# Patient Record
Sex: Female | Born: 1975 | Race: Black or African American | Hispanic: No | Marital: Single | Smoking: Current every day smoker
Health system: Southern US, Community
[De-identification: ages and names within clinical notes are randomized; demographics above are authoritative.]

## PROBLEM LIST (undated history)

## (undated) DIAGNOSIS — I10 Essential (primary) hypertension: Secondary | ICD-10-CM

---

## 2004-10-12 ENCOUNTER — Emergency Department (HOSPITAL_COMMUNITY): Admission: EM | Admit: 2004-10-12 | Discharge: 2004-10-12 | Payer: Self-pay | Admitting: Emergency Medicine

## 2005-01-22 ENCOUNTER — Emergency Department (HOSPITAL_COMMUNITY): Admission: EM | Admit: 2005-01-22 | Discharge: 2005-01-22 | Payer: Self-pay | Admitting: Emergency Medicine

## 2005-04-30 ENCOUNTER — Ambulatory Visit (HOSPITAL_COMMUNITY): Admission: RE | Admit: 2005-04-30 | Discharge: 2005-04-30 | Payer: Self-pay | Admitting: Chiropractic Medicine

## 2006-12-26 ENCOUNTER — Ambulatory Visit (HOSPITAL_COMMUNITY): Admission: RE | Admit: 2006-12-26 | Discharge: 2006-12-26 | Payer: Self-pay | Admitting: Chiropractic Medicine

## 2008-06-02 ENCOUNTER — Inpatient Hospital Stay (HOSPITAL_COMMUNITY): Admission: AD | Admit: 2008-06-02 | Discharge: 2008-06-03 | Payer: Self-pay | Admitting: Obstetrics

## 2008-06-19 ENCOUNTER — Inpatient Hospital Stay (HOSPITAL_COMMUNITY): Admission: AD | Admit: 2008-06-19 | Discharge: 2008-06-19 | Payer: Self-pay | Admitting: Obstetrics

## 2008-07-24 ENCOUNTER — Inpatient Hospital Stay (HOSPITAL_COMMUNITY): Admission: AD | Admit: 2008-07-24 | Discharge: 2008-07-24 | Payer: Self-pay | Admitting: Obstetrics

## 2008-08-29 ENCOUNTER — Inpatient Hospital Stay (HOSPITAL_COMMUNITY): Admission: AD | Admit: 2008-08-29 | Discharge: 2008-08-29 | Payer: Self-pay | Admitting: Obstetrics

## 2008-09-04 ENCOUNTER — Inpatient Hospital Stay (HOSPITAL_COMMUNITY): Admission: RE | Admit: 2008-09-04 | Discharge: 2008-09-07 | Payer: Self-pay | Admitting: Obstetrics

## 2010-07-07 ENCOUNTER — Emergency Department (HOSPITAL_COMMUNITY): Admission: EM | Admit: 2010-07-07 | Discharge: 2010-07-07 | Payer: Self-pay | Admitting: Emergency Medicine

## 2010-08-18 ENCOUNTER — Ambulatory Visit: Payer: Self-pay | Admitting: Internal Medicine

## 2010-08-18 DIAGNOSIS — N63 Unspecified lump in unspecified breast: Secondary | ICD-10-CM

## 2010-08-18 DIAGNOSIS — H02849 Edema of unspecified eye, unspecified eyelid: Secondary | ICD-10-CM | POA: Insufficient documentation

## 2010-08-18 LAB — CONVERTED CEMR LAB
BUN: 9 mg/dL (ref 6–23)
CO2: 25 meq/L (ref 19–32)
Calcium: 9.5 mg/dL (ref 8.4–10.5)
Chloride: 104 meq/L (ref 96–112)
Creatinine, Ser: 0.77 mg/dL (ref 0.40–1.20)
Glucose, Bld: 72 mg/dL (ref 70–99)
Potassium: 4.7 meq/L (ref 3.5–5.3)
Sodium: 139 meq/L (ref 135–145)
TSH: 1.866 microintl units/mL (ref 0.350–4.500)

## 2010-09-12 ENCOUNTER — Encounter (INDEPENDENT_AMBULATORY_CARE_PROVIDER_SITE_OTHER): Payer: Self-pay | Admitting: Internal Medicine

## 2010-11-06 ENCOUNTER — Encounter (INDEPENDENT_AMBULATORY_CARE_PROVIDER_SITE_OTHER): Payer: Self-pay | Admitting: Internal Medicine

## 2010-11-06 ENCOUNTER — Ambulatory Visit: Payer: Self-pay | Admitting: Internal Medicine

## 2010-11-06 DIAGNOSIS — R030 Elevated blood-pressure reading, without diagnosis of hypertension: Secondary | ICD-10-CM

## 2010-11-06 DIAGNOSIS — A5901 Trichomonal vulvovaginitis: Secondary | ICD-10-CM | POA: Insufficient documentation

## 2010-11-06 DIAGNOSIS — J309 Allergic rhinitis, unspecified: Secondary | ICD-10-CM | POA: Insufficient documentation

## 2010-11-06 LAB — CONVERTED CEMR LAB
Bilirubin Urine: NEGATIVE
Blood in Urine, dipstick: NEGATIVE
Chlamydia, DNA Probe: NEGATIVE
GC Probe Amp, Genital: NEGATIVE
Glucose, Urine, Semiquant: NEGATIVE
Ketones, urine, test strip: NEGATIVE
Nitrite: NEGATIVE
Protein, U semiquant: NEGATIVE
Specific Gravity, Urine: <1.005
Urobilinogen, UA: 0.2
WBC Urine, dipstick: NEGATIVE
pH: 6.5

## 2010-11-16 ENCOUNTER — Encounter (INDEPENDENT_AMBULATORY_CARE_PROVIDER_SITE_OTHER): Payer: Self-pay | Admitting: Internal Medicine

## 2010-11-20 ENCOUNTER — Ambulatory Visit
Admission: RE | Admit: 2010-11-20 | Discharge: 2010-11-20 | Payer: Self-pay | Source: Home / Self Care | Attending: Internal Medicine | Admitting: Internal Medicine

## 2010-11-20 ENCOUNTER — Encounter (INDEPENDENT_AMBULATORY_CARE_PROVIDER_SITE_OTHER): Payer: Self-pay | Admitting: Internal Medicine

## 2010-11-20 LAB — CONVERTED CEMR LAB
Cholesterol: 188 mg/dL (ref 0–200)
LDL Cholesterol: 124 mg/dL — ABNORMAL HIGH (ref 0–99)
Rapid HIV Screen: NEGATIVE
Total CHOL/HDL Ratio: 3.8
Triglycerides: 76 mg/dL (ref <150)
VLDL: 15 mg/dL (ref 0–40)

## 2010-11-25 ENCOUNTER — Telehealth (INDEPENDENT_AMBULATORY_CARE_PROVIDER_SITE_OTHER): Payer: Self-pay | Admitting: Internal Medicine

## 2010-12-08 ENCOUNTER — Ambulatory Visit: Admit: 2010-12-08 | Payer: Self-pay | Admitting: Internal Medicine

## 2010-12-08 NOTE — Assessment & Plan Note (Signed)
Summary: req cpp, poss lumps on breast /lr   Vital Signs:  Patient profile:   35 year old female Menstrual status:  regular LMP:     07/23/2010 Height:      68 inches Weight:      226.2 pounds Temp:     98.3 degrees F Pulse rate:   78 / minute Pulse rhythm:   regular Resp:     16 per minute BP sitting:   132 / 96  Vitals Entered By: Michelle Nasuti (August 18, 2010 10:59 AM) CC: ESTABLISH CARE PT REQUEST CPP. PT C/O LUMP IN R BREAST X 3 WEEKS AGO Is Patient Diabetic? No Pain Assessment Patient in pain? no       Does patient need assistance? Ambulation Normal LMP (date): 07/23/2010     Menstrual Status regular Enter LMP: 07/23/2010   CC:  ESTABLISH CARE PT REQUEST CPP. PT C/O LUMP IN R BREAST X 3 WEEKS AGO.  History of Present Illness: 35 yo female here to establish  Concerns:  1.  Right tender breast lump near nipple area --noted 3 weeks ago.  Initially sore and swollen, but swelling decreased and no longer sore.  No drainage from the lesion noted.  Has never had happen before.  Not nursing.  No new bras that she has been wearing.  No fever.  2.  Puffiness of eyes in recent days to weeks.  No edema elsewhere.  No itchy, watery eyes, no sneezing or itchy watery nose, throat symptoms.  States she is sleeping well and feels well rested    Habits & Providers  Alcohol-Tobacco-Diet     Tobacco Status: current     Other Tobacco cigar     Other per week 14  Current Medications (verified): 1)  None  Allergies (verified): No Known Drug Allergies  Past History:  Past Medical History: unremarkable.  Past Surgical History: 1.   03/1995:  C section 2.   07/19/2002:  Csection 3.   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  Worked at [employer] for many years before moving to [place]. Unemployed currently Lives at home with 3 children. Tobacco Use:  Started age20--stopped with first child and started back up age 52--smokes 2 Black and Milds daily Alcohol:  Occasional Drug:  never. Smoking Status:  current  Physical Exam  Eyes:  No conjunctival injection, no watering.  Soft tissue about eyes and lids are indeed puffy appearing. Neck:  No thyromegaly appreciated.  No nodule. Breasts:  No mass,  thickening, tenderness, bulging, retraction, inflamation, nipple discharge or skin changes noted.  Generalized lumpiness beneath skin at margin of areola bilaterally--area at 10 O'Clock on right areola is where pt. feels some tenderness and there is a bit of lumpiness there--pea sized lesion. Extremities:  No edema.   Impression & Recommendations:  Problem # 1:  BREAST MASS, RIGHT (ICD-611.72) No concerning findings. Will get her back in next 2-3 months for CPP and reevaluate  to call if notes mass returning.  Problem # 2:  EDEMA OF EYELID (ICD-374.82) Not clear why she is having this Orders: T-Basic Metabolic Panel [id number]) T-TSH [phone])  Patient Instructions: 1)  Schedule for CPP first available with Dr. Delrae Alfred

## 2010-12-08 NOTE — Letter (Signed)
Summary: *HSN Results Follow up  [facility]  [address] [place], [place] [address]   Phone: [phone]  Fax: [phone]      09/12/2010   Alyza T Salahuddin [address] [place], [place]  [address]   Dear  Ms. Rozena Lineback,                            ____S.Drinkard,FNP   ____D. Gore,FNP       ____B. McPherson,MD   ____V. Rankins,MD    _X___E. Crystalmarie Yasin,MD    ____N. Daphine Deutscher, FNP  ____D. Reche Dixon, MD    ____K. Philipp Deputy, MD    ____Other     This letter is to inform you that your recent test(s):  _______Pap Smear    ___X____Lab Test     _______X-ray    ___X____ is within acceptable limits  _______ requires a medication change  _______ requires a follow-up lab visit  _______ requires a follow-up visit with your provider   Comments:  Thyroid and kidney testing was fine.       _________________________________________________________ If you have any questions, please contact our office                     Sincerely,  Julieanne Manson MD [facility]

## 2010-12-10 NOTE — Assessment & Plan Note (Signed)
Summary: BP recheck, labs  Nurse Visit   Vital Signs:  Patient profile:   35 year old female Menstrual status:  regular Weight:      220.5 pounds Temp:     97.0 degrees F oral Pulse rate:   76 / minute Pulse rhythm:   regular Resp:     20 per minute BP sitting:   124 / 92  (right arm) Cuff size:   regular  Vitals Entered By: Dutch Quint RN (November 20, 2010 10:01 AM) CC: BP recheck Is Patient Diabetic? No Pain Assessment Patient in pain? no       Does patient need assistance? Functional Status Self care Ambulation Normal   Review of Systems CV:  Denies CP, headache, visual changes, peripheral edema- asymptomatic.Marland Kitchen   Physical Exam  General:  alert, well-developed, well-nourished, well-hydrated, and overweight-appearing.     Patient Instructions: 1)  Reviewed with Dr. Delrae Alfred 2)  Your blood pressure is better, but still elevated. 3)  We will let you know the results of your blood work. 4)  Watch your salt intake, especially "hidden" salt in soups, processed food, soda. 5)  Return in one month for follow-up blood pressure check.  If it's still elevated, we may consider starting you on some medication. 6)  Call if you have any questions or anything changes.   Impression & Recommendations:  Problem # 1:  ELEVATED BLOOD PRESSURE WITHOUT DIAGNOSIS OF HYPERTENSION (ICD-796.2) BP better, still elevated Labs done To monitor salt intake Return in one month for f/u BP check with triage.  If BP still elevated, may consider meds  Orders: T-Lipid Profile (40981-19147) Est. Patient Level I (82956)  Complete Medication List: 1)  Metronidazole 500 Mg Tabs (Metronidazole) .... 4 tabs by mouth for one dose 2)  Loratadine 10 Mg Tabs (Loratadine) .Marland Kitchen.. 1 tab by mouth daily  Other Orders: T-Syphilis Test (RPR) ([id number]) Rapid HIV  (92370)   CC:  BP recheck.  History of Present Illness: 11/06/10 BP 130/94.  Not taking any BP meds at present - here for recheck and  labs.  States asymptomatic.   Allergies: No Known Drug Allergies Laboratory Results    Other Tests  Rapid HIV: negative   Orders Added: 1)  T-Lipid Profile [80061-22930] 2)  T-Syphilis Test (RPR) [69629-52841] 3)  Est. Patient Level I [32440] 4)  Rapid HIV  [10272]

## 2010-12-10 NOTE — Assessment & Plan Note (Signed)
Summary: FU FOR CPP///KT   Vital Signs:  Patient profile:   35 year old female Menstrual status:  regular Height:      68 inches Weight:      219 pounds BMI:     33.42 Temp:     97.8 degrees F oral Pulse rate:   80 / minute Pulse rhythm:   regular Resp:     18 per minute BP sitting:   130 / 94  (left arm) Cuff size:   regular  Vitals Entered By: Armenia Shannon (November 06, 2010 2:24 PM)  Vision Screening:Left eye w/o correction: 20 / 13 Right Eye w/o correction: 20 / 15-1 Both eyes w/o correction:  20/ 13-1        Vision Entered By: Armenia Shannon (November 06, 2010 2:31 PM)   History of Present Illness: 35 yo female here for CPP.  1.  Breast lumps:  Went away.  2.  Eyelid puffiness:  has not improved. No itchiness or watering. Never red.  No sense of sinuse congestion.  Eyes do not feel heavy.  Worse when awakens in morning.  Actually looks like water in leds in the morning.  Nothing new in bedroom.  No pets.  No sneezing.  There are times, however, when is sniffling and feels nasally congested.  No sore throat.  Allergies (verified): No Known Drug Allergies  Past History:  Past Medical History: Reviewed history from 08/18/2010 and no changes required. unremarkable.  Past Surgical History: Reviewed history from 08/18/2010 and no changes required. 1.   03/1995:  C section 2.   07/19/2002:  Csection 3.   09/04/08:  C section  Family History: Reviewed history from 08/18/2010 and no changes required. Mother, 46:  Healthy Father, 40:  Healthy 6 Sisters:  Oldest sister with hypertension, second oldest sister with new onset DM, obesity 2 Brothers:  Healthy 3 Children:  15, 8, 2:  all healthy  Social History: Reviewed history from 08/18/2010 and no changes required. Divorced Degree in Chiropractor, CNA, phlebotomy.  Worked at [employer] for many years before moving to [place]. Unemployed currently Lives at home with 3 children. Tobacco  Use:  Started age20--stopped with first child and started back up age 12--smokes 2 Black and Milds daily Alcohol:  Occasional Drug:  never.  Review of Systems General:  Energy good even chasing a 35 yo. Eyes:  glasses--astigmatism. ENT:  Denies decreased hearing. CV:  Denies chest pain or discomfort and palpitations. Resp:  Denies shortness of breath. GI:  Denies abdominal pain, bloody stools, constipation, dark tarry stools, and diarrhea. GU:  LMP:  10/28/10--normal and regular Mild dysuria noted today. MS:  Denies joint pain, joint redness, and joint swelling. Derm:  Denies lesion(s) and rash. Neuro:  Denies numbness, tingling, and weakness. Psych:  Denies anxiety, depression, and suicidal thoughts/plans; PHQ9 scored 0.  Physical Exam  General:  Overweight, NAD Head:  Normocephalic and atraumatic without obvious abnormalities. No apparent alopecia or balding. Eyes:  No corneal or conjunctival inflammation noted. EOMI. Perrla. Funduscopic exam benign, without hemorrhages, exudates or papilledema. Vision grossly normal.  Eyelids a bit puffy Ears:  External ear exam shows no significant lesions or deformities.  Otoscopic examination reveals clear canals, tympanic membranes are intact bilaterally without bulging, retraction, inflammation or discharge. Hearing is grossly normal bilaterally. Nose:  External nasal examination shows no deformity or inflammation. Nasal mucosa are pink and moist without lesions or exudates. Mouth:  Oral mucosa and oropharynx without lesions or exudates.  Teeth in good repair. Neck:  No deformities, masses, or tenderness noted. Chest Wall:  No deformities, masses, or tenderness noted. Breasts:  No mass, nodules, thickening, tenderness, bulging, retraction, inflamation, nipple discharge or skin changes noted.   Lungs:  Normal respiratory effort, chest expands symmetrically. Lungs are clear to auscultation, no crackles or wheezes. Heart:  Normal rate and regular  rhythm. S1 and S2 normal without gallop, murmur, click, rub or other extra sounds. Abdomen:  Bowel sounds positive,abdomen soft and non-tender without masses, organomegaly or hernias noted. Genitalia:  Pelvic Exam:        External: normal female genitalia without lesions or masses--possibly a bit of mucosal inflammation about vaginal introitus        Vagina: normal without lesions or masses        Cervix: normal without lesions or masses        Adnexa: normal bimanual exam without masses or fullness        Uterus: normal by palpation        Pap smear: performed Msk:  No deformity or scoliosis noted of thoracic or lumbar spine.   Pulses:  R and L carotid,radial,femoral,dorsalis pedis and posterior tibial pulses are full and equal bilaterally Extremities:  No clubbing, cyanosis, edema, or deformity noted with normal full range of motion of all joints.   Neurologic:  No cranial nerve deficits noted. Station and gait are normal. Plantar reflexes are down-going bilaterally. DTRs are symmetrical throughout. Sensory, motor and coordinative functions appear intact. Skin:  Intact without suspicious lesions or rashes Cervical Nodes:  No lymphadenopathy noted Axillary Nodes:  No palpable lymphadenopathy Inguinal Nodes:  No significant adenopathy Psych:  Cognition and judgment appear intact. Alert and cooperative with normal attention span and concentration. 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 states they broke up.  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 1 tab by mouth daily  Problem # 4:  ELEVATED BLOOD PRESSURE WITHOUT DIAGNOSIS OF HYPERTENSION (ICD-796.2) recheck with fasting labs  Complete Medication List: 1)  Metronidazole 500 Mg Tabs (Metronidazole) .... 4 tabs by mouth for one dose 2)  Loratadine 10 Mg Tabs (Loratadine) .Marland Kitchen.. 1 tab by mouth daily  Patient Instructions: 1)  Schedule for bp check and fasting labs in next 2 weeks:  FLP, HIV, RPR 2)  Follow up with Dr. Delrae Alfred in 4 months for blood pressure Prescriptions: LORATADINE 10 MG TABS (LORATADINE) 1 tab by mouth daily  #30 x 11   Entered and Authorized by:   Julieanne Manson MD   Signed by:   Julieanne Manson MD on 11/06/2010   Method used:   Faxed to ...       [facility] (retail)       [address] [place], [place]  [address]       Ph: [phone]       Fax: [phone]   RxID:   [id number] METRONIDAZOLE 500 MG TABS (METRONIDAZOLE) 4 tabs by mouth for one dose  #  4 x 0   Entered and Authorized by:   Julieanne Manson MD   Signed by:   Julieanne Manson MD on 11/06/2010   Method used:   Electronically to        [facility] [phone]* (retail)       [address]       [place], [place]  [address]       Ph: [phone]       Fax: [phone]   RxID:   [id number]    Orders Added: 1)  Vision Screening [phone] 2)  Est. Patient age 54-39 [33] 3)  Pap Smear, Thin Prep ( Collection of) [Q0091] 4)  KOH/ WET Mount [87210] 5)  T- GC Chlamydia [02725] 6)  T-Pap Smear, Thin Prep [36644]   Not Administered:    Influenza Vaccine not given due to: declined   Laboratory Results   Urine Tests    Routine Urinalysis   Glucose: negative   (Normal Range: Negative) Bilirubin: negative   (Normal Range: Negative) Ketone: negative   (Normal Range: Negative) Spec. Gravity: <1.005   (Normal  Range: 1.003-1.035) Blood: negative   (Normal Range: Negative) pH: 6.5   (Normal Range: 5.0-8.0) Protein: negative   (Normal Range: Negative) Urobilinogen: 0.2   (Normal Range: 0-1) Nitrite: negative   (Normal Range: Negative) Leukocyte Esterace: negative   (Normal Range: Negative)         Preventive Care Screening     Mammogram: never SBE:  Lumps near nipple have resolved Last Pap:  10/2009--always normal Osteoprevention:  1 serving milk daily.  Walking regularly Refuses flu vaccine.  Cannot recall last Td

## 2010-12-10 NOTE — Progress Notes (Signed)
Summary: Wants dental referral  Phone Note Call from Patient   Summary of Call: Wants dental referral.  Had mentioned on her way out after nurse visit.  Left message with female for pt. to return call.   Initial call taken by: Dutch Quint RN,  November 25, 2010 3:30 PM  Follow-up for Phone Call        Having problems with wisdom teeth in general.  Are really painful and throbbing.  Having trouble chewing, pain gives her headaches.  Denies fever, having swelling to gums.  Has been continuing for three weeks, would like dental referral to extract wisdom teeth. Follow-up by: Dutch Quint RN,  November 26, 2010 6:19 PM  Additional Follow-up for Phone Call Additional follow up Details #1::        She will need an OV as usual. I would just make a pt. an appt. If she does not have an infection, however, she needs to know that we will not be able to refer her. Let her know her cholesterol is okay, but would like her to work on her diet--eat more vegetables and fruits (5 servings a day if possible) and less fried fatty food and to get regular physical activity Additional Follow-up by: Julieanne Manson MD,  November 30, 2010 10:54 AM    Additional Follow-up for Phone Call Additional follow up Details #2::    Pt. advised of lab results and provider's recommendations, as well as response re dental referral.  Pt. verbalized understanding and agreement.  Appt. scheduled 12/08/10 for persistent tooth pain.  Dutch Quint RN  November 30, 2010 11:26 AM

## 2010-12-10 NOTE — Letter (Signed)
Summary: *HSN Results Follow up  [facility]  [address] [place], [place] [address]   Phone: [phone]  Fax: [phone]      11/16/2010   Aslin T Nesbitt [address] [place], [place]  [address]   Dear  Ms. Xiao Lalone,                            ____S.Drinkard,FNP   ____D. Gore,FNP       ____B. McPherson,MD   ____V. Rankins,MD    _X___E. Staton Markey,MD    ____N. Daphine Deutscher, FNP  ____D. Reche Dixon, MD    ____K. Philipp Deputy, MD    ____Other     This letter is to inform you that your recent test(s):  __X_____Pap Smear    ___X____Lab Test     _______X-ray    __X_____ is within acceptable limits  _______ requires a medication change  _______ requires a follow-up lab visit  _______ requires a follow-up visit with your provider   Comments:       _________________________________________________________ If you have any questions, please contact our office                     Sincerely,  Julieanne Manson MD [facility]

## 2010-12-10 NOTE — Progress Notes (Signed)
Summary: Office Visit//DEPRESSION SCREENIG  Office Visit//DEPRESSION SCREENIG   Imported By: Arta Bruce 11/10/2010 16:53:18  _____________________________________________________________________  External Attachment:    Type:   Image     Comment:   External Document

## 2011-01-13 ENCOUNTER — Inpatient Hospital Stay (HOSPITAL_COMMUNITY)
Admission: AD | Admit: 2011-01-13 | Discharge: 2011-01-13 | Disposition: A | Discharge status: Home / Self Care | Payer: Self-pay | Source: Ambulatory Visit | Attending: Obstetrics & Gynecology | Admitting: Obstetrics & Gynecology

## 2011-01-13 ENCOUNTER — Encounter (INDEPENDENT_AMBULATORY_CARE_PROVIDER_SITE_OTHER): Payer: Self-pay | Admitting: Internal Medicine

## 2011-01-13 ENCOUNTER — Telehealth (INDEPENDENT_AMBULATORY_CARE_PROVIDER_SITE_OTHER): Payer: Self-pay | Admitting: Internal Medicine

## 2011-01-13 DIAGNOSIS — R109 Unspecified abdominal pain: Secondary | ICD-10-CM

## 2011-01-13 DIAGNOSIS — A499 Bacterial infection, unspecified: Secondary | ICD-10-CM | POA: Insufficient documentation

## 2011-01-13 DIAGNOSIS — B9689 Other specified bacterial agents as the cause of diseases classified elsewhere: Secondary | ICD-10-CM | POA: Insufficient documentation

## 2011-01-13 DIAGNOSIS — N760 Acute vaginitis: Secondary | ICD-10-CM | POA: Insufficient documentation

## 2011-01-13 LAB — URINALYSIS, ROUTINE W REFLEX MICROSCOPIC
Bilirubin Urine: NEGATIVE
Glucose, UA: NEGATIVE mg/dL
Ketones, ur: NEGATIVE mg/dL
Leukocytes, UA: NEGATIVE
Nitrite: NEGATIVE
Specific Gravity, Urine: 1.02 (ref 1.005–1.030)
pH: 6 (ref 5.0–8.0)

## 2011-01-13 LAB — WET PREP, GENITAL
Trich, Wet Prep: NONE SEEN
Yeast Wet Prep HPF POC: NONE SEEN

## 2011-01-13 LAB — URINE MICROSCOPIC-ADD ON

## 2011-01-13 LAB — POCT PREGNANCY, URINE: Preg Test, Ur: NEGATIVE

## 2011-01-14 LAB — GC/CHLAMYDIA PROBE AMP, GENITAL
Chlamydia, DNA Probe: NEGATIVE
GC Probe Amp, Genital: NEGATIVE

## 2011-01-26 NOTE — Progress Notes (Signed)
Summary: pain, wanted to talk to [facility]  Phone Note Call from Patient Call back at [phone]   Reason for Call: Talk to Nurse Summary of Call: pat of Veronica King, discuss pain Initial call taken by: Ernestine Mcmurray,  January 13, 2011 8:05 AM  Follow-up for Phone Call        Pt. called [facility], went to [facility] ED.  Stated dx is BV - given ABT -- is currently "doing fine."  States she does not need f/u at present -- will call back if needed.  ED record on your desk.  Dutch Quint RN  January 14, 2011 12:42 PM

## 2011-01-26 NOTE — Letter (Signed)
Summary: NURSE NOTE ONLY  NURSE NOTE ONLY   Imported By: Arta Bruce 01/22/2011 16:27:59  _____________________________________________________________________  External Attachment:    Type:   Image     Comment:   External Document

## 2011-03-23 NOTE — Op Note (Signed)
NAMECAYLE, Veronica King                ACCOUNT NO.:  [id number]   MEDICAL RECORD NO.:  [id number]          PATIENT TYPE:  INP   LOCATION:  9143                          FACILITY:  [facility]   PHYSICIAN:  Kathreen Cosier, M.D.DATE OF BIRTH:  1976-02-20   DATE OF PROCEDURE:  09/04/2008  DATE OF DISCHARGE:                               OPERATIVE REPORT   PREOPERATIVE DIAGNOSIS:  Previous cesarean section x2 at term for  repeat.   POSTOPERATIVE DIAGNOSIS:  Previous cesarean section x2 at term for  repeat.   ANESTHESIA:  Spinal.   SURGEON:  Kathreen Cosier, M.D.   FIRST ASSISTANT:  Charles A. Clearance Coots, M.D.   PROCEDURE:  The patient placed on the operating table in the supine  position.  After the spinal administered, the abdomen prepped and  draped.  Bladder emptied with a Foley catheter.  A transverse suprapubic  incision made through the old scar, carried down to the rectus fascia.  Fascia cleaned and incised to length of the incision.  The recti muscles  were retracted laterally.  Peritoneum incised longitudinally.  Transverse incision made in the visceral peritoneum above the bladder.  Bladder mobilized inferiorly.  Transverse low uterine incision made.  Fluid cleared.  The patient delivered from the OP position of a female,  Apgar 8 and 9 weighing 7 pound 8 ounces and the team was in attendance.  Placenta was fundal, removed manually and sent to Labor and Delivery.  Uterine cavity cleaned with dry laps.  Uterine incision closed in 1  layer with continuous suture of #1 chromic.  Hemostasis was  satisfactory.  Bladder flap reattached with 2-0 chromic.  Uterus well  contracted.  Tubes and ovaries normal.  Abdomen closed in layers;  peritoneum, continuous suture of 0-chromic; fascia, continuous suture of  0-Dexon; and the skin closed with subcuticular stitch of 4-0 Monocryl.  Blood loss 500 mL.  The patient tolerated the procedure well and taken  to recovery room in good  condition.           ______________________________  Kathreen Cosier, M.D.     BAM/MEDQ  D:  09/04/2008  T:  09/04/2008  Job:  [id number]

## 2011-03-26 NOTE — Discharge Summary (Signed)
Veronica King, Veronica King                ACCOUNT NO.:  [id number]   MEDICAL RECORD NO.:  [id number]          PATIENT TYPE:  INP   LOCATION:  9143                          FACILITY:  [facility]   PHYSICIAN:  Kathreen Cosier, M.D.DATE OF BIRTH:  [birth year]   DATE OF ADMISSION:  09/04/2008  DATE OF DISCHARGE:  09/07/2008                               DISCHARGE SUMMARY   The patient is a 35 year old gravida 3, para 2-0-2, [place] September 08, 2008.  She had 2 C-sections in the past, and she was now at term and in  for repeat C-section.  She had a repeat low-transverse cesarean section.  Fluid clear.  She had a female from the OP position weighing 7 pounds 8  ounces, Apgar 8 and 9, and the placenta was sent to Labor and Delivery.  Postoperatively, she did fine.  Her hemoglobin was 10.1 postop.  She was  discharged on the third postoperative day ambulatory on a regular diet  to see me in 6 weeks.   DISCHARGE DIAGNOSIS:  Status post repeat low transverse cesarean section  at term.           ______________________________  Kathreen Cosier, M.D.     BAM/MEDQ  D:  10/09/2008  T:  10/09/2008  Job:  [id number]

## 2011-04-16 ENCOUNTER — Emergency Department (HOSPITAL_COMMUNITY)
Admission: EM | Admit: 2011-04-16 | Discharge: 2011-04-16 | Disposition: A | Discharge status: Home / Self Care | Payer: Self-pay | Attending: Nurse Practitioner | Admitting: Nurse Practitioner

## 2011-04-16 DIAGNOSIS — Z203 Contact with and (suspected) exposure to rabies: Secondary | ICD-10-CM | POA: Insufficient documentation

## 2011-04-16 DIAGNOSIS — I10 Essential (primary) hypertension: Secondary | ICD-10-CM | POA: Insufficient documentation

## 2011-04-19 ENCOUNTER — Inpatient Hospital Stay (INDEPENDENT_AMBULATORY_CARE_PROVIDER_SITE_OTHER)
Admission: RE | Admit: 2011-04-19 | Discharge: 2011-04-19 | Disposition: A | Discharge status: Home / Self Care | Payer: Self-pay | Source: Ambulatory Visit | Attending: Emergency Medicine | Admitting: Emergency Medicine

## 2011-04-19 DIAGNOSIS — Z23 Encounter for immunization: Secondary | ICD-10-CM

## 2011-04-23 ENCOUNTER — Inpatient Hospital Stay (INDEPENDENT_AMBULATORY_CARE_PROVIDER_SITE_OTHER)
Admission: RE | Admit: 2011-04-23 | Discharge: 2011-04-23 | Disposition: A | Discharge status: Home / Self Care | Payer: Self-pay | Source: Ambulatory Visit | Attending: Family Medicine | Admitting: Family Medicine

## 2011-04-23 DIAGNOSIS — Z23 Encounter for immunization: Secondary | ICD-10-CM

## 2011-04-30 ENCOUNTER — Inpatient Hospital Stay (INDEPENDENT_AMBULATORY_CARE_PROVIDER_SITE_OTHER)
Admission: RE | Admit: 2011-04-30 | Discharge: 2011-04-30 | Disposition: A | Discharge status: Home / Self Care | Payer: Self-pay | Source: Ambulatory Visit

## 2011-04-30 DIAGNOSIS — Z23 Encounter for immunization: Secondary | ICD-10-CM

## 2011-08-06 LAB — URINALYSIS, ROUTINE W REFLEX MICROSCOPIC
Glucose, UA: NEGATIVE
Hgb urine dipstick: NEGATIVE
Ketones, ur: NEGATIVE
Leukocytes, UA: NEGATIVE
Nitrite: NEGATIVE
Protein, ur: 30 — AB
Specific Gravity, Urine: >1.03 — ABNORMAL HIGH
Urobilinogen, UA: 1
pH: 6

## 2011-08-06 LAB — WET PREP, GENITAL
Clue Cells Wet Prep HPF POC: NONE SEEN
Trich, Wet Prep: NONE SEEN
Yeast Wet Prep HPF POC: NONE SEEN

## 2011-08-06 LAB — URINE MICROSCOPIC-ADD ON: RBC / HPF: NONE SEEN

## 2011-08-06 LAB — URINE CULTURE
Colony Count: NO GROWTH
Culture: NO GROWTH

## 2011-08-10 LAB — CBC
HCT: 30.3 — ABNORMAL LOW
HCT: 30.4 — ABNORMAL LOW
Hemoglobin: 10.1 — ABNORMAL LOW
Hemoglobin: 10.1 — ABNORMAL LOW
MCHC: 33.2
MCHC: 33.3
MCV: 88
MCV: 88.4
Platelets: 263
Platelets: 283
RBC: 3.44 — ABNORMAL LOW
RBC: 3.44 — ABNORMAL LOW
RDW: 15.5
RDW: 15.8 — ABNORMAL HIGH
WBC: 5.5
WBC: 8.2

## 2011-08-10 LAB — RPR: RPR Ser Ql: NONREACTIVE

## 2012-01-31 ENCOUNTER — Encounter (HOSPITAL_COMMUNITY): Payer: Self-pay | Admitting: *Deleted

## 2012-01-31 ENCOUNTER — Emergency Department (HOSPITAL_COMMUNITY)
Admission: EM | Admit: 2012-01-31 | Discharge: 2012-01-31 | Disposition: A | Discharge status: Home / Self Care | Payer: Self-pay | Source: Home / Self Care

## 2012-01-31 DIAGNOSIS — J0300 Acute streptococcal tonsillitis, unspecified: Secondary | ICD-10-CM

## 2012-01-31 DIAGNOSIS — J020 Streptococcal pharyngitis: Secondary | ICD-10-CM

## 2012-01-31 HISTORY — DX: Essential (primary) hypertension: I10

## 2012-01-31 MED ORDER — HYDROCODONE-ACETAMINOPHEN 7.5-500 MG/15ML PO SOLN: 10.0000 mL | ORAL | Status: AC | PRN

## 2012-01-31 MED ORDER — AZITHROMYCIN 250 MG PO TABS: 250.0000 mg | ORAL_TABLET | Freq: Every day | ORAL | Status: AC

## 2012-01-31 NOTE — ED Provider Notes (Signed)
History     CSN: [id number]  Arrival date & time 01/31/12  1552   None     Chief Complaint  Patient presents with  . Sore Throat    (Consider location/radiation/quality/duration/timing/severity/associated sxs/prior treatment) HPI Comments: The patient reports sore throat, tonsillar exudate, chills, cough, body aches x 3 days.  States she is drinking but has not eaten because of the pain in her throat.  She has also been having diarrhea.  Denies abdominal pain, chest pain, SOB, vomiting or urinary symptoms.    The history is provided by the patient.    Past Medical History  Diagnosis Date  . Hypertension     History reviewed. No pertinent past surgical history.  No family history on file.  History  Substance Use Topics  . Smoking status: Current Everyday Smoker  . Smokeless tobacco: Not on file  . Alcohol Use: Yes    OB History    Grav Para Term Preterm Abortions TAB SAB Ect Mult Living                  Review of Systems  Constitutional: Positive for chills.  HENT: Positive for sore throat. Negative for ear pain, congestion, rhinorrhea, drooling, trouble swallowing and neck stiffness.   Respiratory: Positive for cough. Negative for shortness of breath, wheezing and stridor.   Gastrointestinal: Positive for diarrhea. Negative for nausea, vomiting, abdominal pain and blood in stool.  Genitourinary: Negative for dysuria, urgency, frequency, vaginal bleeding and vaginal discharge.    Allergies  Review of patient's allergies indicates not on file.  Home Medications   Current Outpatient Rx  Name Route Sig Dispense Refill  . LISINOPRIL 20 MG PO TABS Oral Take 20 mg by mouth daily.      BP 126/81  Pulse 86  Temp(Src) 99 F (37.2 C) (Oral)  Resp 14  SpO2 100%  LMP 01/31/2012  Physical Exam  Nursing note and vitals reviewed. Constitutional: She is oriented to person, place, and time. She appears well-developed and well-nourished. No distress.  HENT:  Head:  Normocephalic and atraumatic.  Mouth/Throat: Uvula is midline and mucous membranes are normal. Mucous membranes are not dry. No uvula swelling. Oropharyngeal exudate, posterior oropharyngeal edema and posterior oropharyngeal erythema present. No tonsillar abscesses.       Bilateral tonsillar enlargement is symmetric.   Neck: Trachea normal, normal range of motion and phonation normal. Neck supple. No tracheal tenderness present. No rigidity. No tracheal deviation and normal range of motion present.  Cardiovascular: Normal rate, regular rhythm and normal heart sounds.   Pulmonary/Chest: Effort normal and breath sounds normal. No stridor. No respiratory distress. She has no wheezes. She has no rales.  Musculoskeletal: Normal range of motion.  Lymphadenopathy:    She has cervical adenopathy.  Neurological: She is alert and oriented to person, place, and time.  Skin: She is not diaphoretic.    ED Course  Procedures (including critical care time)  Labs Reviewed - No data to display No results found.   1. Strep tonsillitis       MDM  Patient with history of chills, now with tonsillar enlargement with exudate, anterior cervical lymphadenopathy, absence of cough.  Will treat empirically for strep.  Patient's airway is widely patent, tolerating secretions without difficulty.  Patient d/c home with antibiotic, pain medication, return precautions.  Patient verbalizes understanding and agrees with plan.           Dillard Cannon [place], [place] 01/31/12 1807

## 2012-01-31 NOTE — ED Notes (Signed)
Pt  Reports   Symptoms  Of  Body  Aches   sorethroat     And  A  Cough     Symptoms  X  3  Days      -    She  Is alert  Oriented     Speaking in  Complete  sentances

## 2012-01-31 NOTE — Discharge Instructions (Signed)
Please read the information below.  Drink plenty of fluids.  You may take ibuprofen (motrin) in addition to the prescribed pain medication but please do not take additional tylenol, as this formula already contains tylenol.  If you develop high fevers while on antibiotics, have difficulty swallowing or breathing (unable to swallow your saliva), or are unable to tolerate fluids by mouth, go directly to the Emergency Room.  You may return to the urgent care at any time for worsening condition or any new symptoms that concern you.    Pharyngitis, Viral and Bacterial Pharyngitis is soreness (inflammation) or infection of the pharynx. It is also called a sore throat. CAUSES  Most sore throats are caused by viruses and are part of a cold. However, some sore throats are caused by strep and other bacteria. Sore throats can also be caused by post nasal drip from draining sinuses, allergies and sometimes from sleeping with an open mouth. Infectious sore throats can be spread from person to person by coughing, sneezing and sharing cups or eating utensils. TREATMENT  Sore throats that are viral usually last 3-4 days. Viral illness will get better without medications (antibiotics). Strep throat and other bacterial infections will usually begin to get better about 24-48 hours after you begin to take antibiotics. HOME CARE INSTRUCTIONS   If the caregiver feels there is a bacterial infection or if there is a positive strep test, they will prescribe an antibiotic. The full course of antibiotics must be taken. If the full course of antibiotic is not taken, you or your child may become ill again. If you or your child has strep throat and do not finish all of the medication, serious heart or kidney diseases may develop.   Drink enough water and fluids to keep your urine clear or pale yellow.   Only take over-the-counter or prescription medicines for pain, discomfort or fever as directed by your caregiver.   Get lots of  rest.   Gargle with salt water ( tsp. of salt in a glass of water) as often as every 1-2 hours as you need for comfort.   Hard candies may soothe the throat if individual is not at risk for choking. Throat sprays or lozenges may also be used.  SEEK MEDICAL CARE IF:   Large, tender lumps in the neck develop.   A rash develops.   Green, yellow-brown or bloody sputum is coughed up.   Your baby is older than 3 months with a rectal temperature of 100.5 F (38.1 C) or higher for more than 1 day.  SEEK IMMEDIATE MEDICAL CARE IF:   A stiff neck develops.   You or your child are drooling or unable to swallow liquids.   You or your child are vomiting, unable to keep medications or liquids down.   You or your child has severe pain, unrelieved with recommended medications.   You or your child are having difficulty breathing (not due to stuffy nose).   You or your child are unable to fully open your mouth.   You or your child develop redness, swelling, or severe pain anywhere on the neck.   You have a fever.   Your baby is older than 3 months with a rectal temperature of 102 F (38.9 C) or higher.   Your baby is 82 months old or younger with a rectal temperature of 100.4 F (38 C) or higher.  MAKE SURE YOU:   Understand these instructions.   Will watch your condition.  Will get help right away if you are not doing well or get worse.  Document Released: 10/25/2005 Document Revised: 10/14/2011 Document Reviewed: 01/22/2008 [facility] Patient Information 2012 [place], [place].  Strep Throat Strep throat is an infection of the throat caused by a bacteria named Streptococcus pyogenes. Your caregiver may call the infection streptococcal "tonsillitis" or "pharyngitis" depending on whether there are signs of inflammation in the tonsils or back of the throat. Strep throat is most common in children from 32 to 58 years old during the cold months of the year, but it can occur in people of any  age during any season. This infection is spread from person to person (contagious) through coughing, sneezing, or other close contact. SYMPTOMS   Fever or chills.   Painful, swollen, red tonsils or throat.   Pain or difficulty when swallowing.   White or yellow spots on the tonsils or throat.   Swollen, tender lymph nodes or "glands" of the neck or under the jaw.   Red rash all over the body (rare).  DIAGNOSIS  Many different infections can cause the same symptoms. A test must be done to confirm the diagnosis so the right treatment can be given. A "rapid strep test" can help your caregiver make the diagnosis in a few minutes. If this test is not available, a light swab of the infected area can be used for a throat culture test. If a throat culture test is done, results are usually available in a day or two. TREATMENT  Strep throat is treated with antibiotic medicine. HOME CARE INSTRUCTIONS   Gargle with 1 tsp of salt in 1 cup of warm water, 3 to 4 times per day or as needed for comfort.   Family members who also have a sore throat or fever should be tested for strep throat and treated with antibiotics if they have the strep infection.   Make sure everyone in your household washes their hands well.   Do not share food, drinking cups, or personal items that could cause the infection to spread to others.   You may need to eat a soft food diet until your sore throat gets better.   Drink enough water and fluids to keep your urine clear or pale yellow. This will help prevent dehydration.   Get plenty of rest.   Stay home from school, daycare, or work until you have been on antibiotics for 24 hours.   Only take over-the-counter or prescription medicines for pain, discomfort, or fever as directed by your caregiver.   If antibiotics are prescribed, take them as directed. Finish them even if you start to feel better.  SEEK MEDICAL CARE IF:   The glands in your neck continue to enlarge.    You develop a rash, cough, or earache.   You cough up green, yellow-brown, or bloody sputum.   You have pain or discomfort not controlled by medicines.   Your problems seem to be getting worse rather than better.  SEEK IMMEDIATE MEDICAL CARE IF:   You develop any new symptoms such as vomiting, severe headache, stiff or painful neck, chest pain, shortness of breath, or trouble swallowing.   You develop severe throat pain, drooling, or changes in your voice.   You develop swelling of the neck, or the skin on the neck becomes red and tender.   You have a fever.   You develop signs of dehydration, such as fatigue, dry mouth, and decreased urination.   You become increasingly sleepy, or  you cannot wake up completely.  Document Released: 10/22/2000 Document Revised: 10/14/2011 Document Reviewed: 12/24/2010 [facility] Patient Information 2012 [place], [place].

## 2012-02-01 ENCOUNTER — Telehealth (HOSPITAL_COMMUNITY): Payer: Self-pay | Admitting: *Deleted

## 2012-02-01 NOTE — ED Notes (Signed)
1300 Pt. called and said her insurance does not cover the medication. I asked her what insurance does she have? She said the orange card. I told her that is not an insurance card. It just makes her eligible for [facility]. She said we sent the Rx. to [facility] and she can get it at [facility] for $4.00. She said the Z- pack is $42.00 at [facility] and the does not know how much the narcotic is. I told her it usually not very expensive. I told her I would call back. Discussed with Esperanza Sheets PA and she said [facility] won't fill our Rx.'s anymore. Pt. has to see their provider to use their pharmacy.  She changed the Rx. to Amoxicillin 500 mg. TID # 30, she can get that at [facility] on the $4.00 list. If the Vicodin is to expensive she can use plain Tylenol. I called pt. back and gave her this information. She wants the Amoxicillin called to [facility] on [address] Rx. called to voicemail @ [phone]. Vassie Moselle 02/01/2012

## 2012-02-01 NOTE — ED Provider Notes (Signed)
Medical screening examination/treatment/procedure(s) were performed by resident physician or non-physician practitioner and as supervising physician I was immediately available for consultation/collaboration.   Barkley Bruns MD.    Linna Hoff, MD 02/01/12 1556

## 2013-08-04 ENCOUNTER — Encounter (HOSPITAL_COMMUNITY): Payer: Self-pay | Admitting: *Deleted

## 2013-08-04 ENCOUNTER — Emergency Department (HOSPITAL_COMMUNITY)
Admission: EM | Admit: 2013-08-04 | Discharge: 2013-08-05 | Disposition: A | Discharge status: Home / Self Care | Payer: Self-pay | Attending: Emergency Medicine | Admitting: Emergency Medicine

## 2013-08-04 DIAGNOSIS — N12 Tubulo-interstitial nephritis, not specified as acute or chronic: Secondary | ICD-10-CM | POA: Insufficient documentation

## 2013-08-04 DIAGNOSIS — F17200 Nicotine dependence, unspecified, uncomplicated: Secondary | ICD-10-CM | POA: Insufficient documentation

## 2013-08-04 DIAGNOSIS — Z3202 Encounter for pregnancy test, result negative: Secondary | ICD-10-CM | POA: Insufficient documentation

## 2013-08-04 DIAGNOSIS — R300 Dysuria: Secondary | ICD-10-CM | POA: Insufficient documentation

## 2013-08-04 DIAGNOSIS — Z79899 Other long term (current) drug therapy: Secondary | ICD-10-CM | POA: Insufficient documentation

## 2013-08-04 DIAGNOSIS — R509 Fever, unspecified: Secondary | ICD-10-CM | POA: Insufficient documentation

## 2013-08-04 DIAGNOSIS — I10 Essential (primary) hypertension: Secondary | ICD-10-CM | POA: Insufficient documentation

## 2013-08-04 LAB — URINALYSIS, ROUTINE W REFLEX MICROSCOPIC
Bilirubin Urine: NEGATIVE
Ketones, ur: NEGATIVE mg/dL
Nitrite: NEGATIVE
Urobilinogen, UA: 0.2 mg/dL (ref 0.0–1.0)
pH: 7 (ref 5.0–8.0)

## 2013-08-04 LAB — URINE MICROSCOPIC-ADD ON

## 2013-08-04 NOTE — ED Notes (Signed)
Pt c/o mid level right sided back pain radiating to left side with urinary retention and low voiding volumes x 1 week. N/V/D

## 2013-08-05 ENCOUNTER — Emergency Department (HOSPITAL_COMMUNITY): Payer: Self-pay

## 2013-08-05 ENCOUNTER — Telehealth (HOSPITAL_COMMUNITY): Payer: Self-pay | Admitting: Emergency Medicine

## 2013-08-05 ENCOUNTER — Encounter (HOSPITAL_COMMUNITY): Payer: Self-pay | Admitting: Radiology

## 2013-08-05 LAB — CBC WITH DIFFERENTIAL/PLATELET
Basophils Absolute: 0 10*3/uL (ref 0.0–0.1)
Basophils Relative: 0 % (ref 0–1)
Eosinophils Absolute: 0 10*3/uL (ref 0.0–0.7)
Eosinophils Relative: 0 % (ref 0–5)
HCT: 36.5 % (ref 36.0–46.0)
Lymphocytes Relative: 22 % (ref 12–46)
Lymphs Abs: 1.6 10*3/uL (ref 0.7–4.0)
MCH: 32.9 pg (ref 26.0–34.0)
MCHC: 35.9 g/dL (ref 30.0–36.0)
MCV: 91.7 fL (ref 78.0–100.0)
Monocytes Absolute: 0.8 10*3/uL (ref 0.1–1.0)
Platelets: 202 10*3/uL (ref 150–400)
RBC: 3.98 MIL/uL (ref 3.87–5.11)
WBC: 7.4 10*3/uL (ref 4.0–10.5)

## 2013-08-05 LAB — COMPREHENSIVE METABOLIC PANEL
Albumin: 3.4 g/dL — ABNORMAL LOW (ref 3.5–5.2)
Alkaline Phosphatase: 121 U/L — ABNORMAL HIGH (ref 39–117)
BUN: 12 mg/dL (ref 6–23)
CO2: 23 mEq/L (ref 19–32)
Calcium: 9 mg/dL (ref 8.4–10.5)
Creatinine, Ser: 0.84 mg/dL (ref 0.50–1.10)
GFR calc Af Amer: >90 mL/min (ref >90)
GFR calc non Af Amer: 88 mL/min — ABNORMAL LOW (ref >90)
Glucose, Bld: 87 mg/dL (ref 70–99)
Total Protein: 7.4 g/dL (ref 6.0–8.3)

## 2013-08-05 LAB — LIPASE, BLOOD: Lipase: 19 U/L (ref 11–59)

## 2013-08-05 MED ORDER — METOCLOPRAMIDE HCL 10 MG PO TABS: 10.0000 mg | ORAL_TABLET | Freq: Four times a day (QID) | ORAL | Status: DC | PRN

## 2013-08-05 MED ORDER — DEXTROSE 5 % IV SOLN
1.0000 g | Freq: Once | INTRAVENOUS | Status: AC
  Administered 2013-08-05: 1 g via INTRAVENOUS
  Filled 2013-08-05: qty 10

## 2013-08-05 MED ORDER — OXYCODONE-ACETAMINOPHEN 5-325 MG PO TABS: 2.0000 | ORAL_TABLET | Freq: Four times a day (QID) | ORAL | Status: DC | PRN

## 2013-08-05 MED ORDER — ONDANSETRON HCL 4 MG/2ML IJ SOLN
4.0000 mg | Freq: Once | INTRAMUSCULAR | Status: AC
  Administered 2013-08-05: 4 mg via INTRAVENOUS
  Filled 2013-08-05: qty 2

## 2013-08-05 MED ORDER — SODIUM CHLORIDE 0.9 % IV BOLUS (SEPSIS)
1000.0000 mL | Freq: Once | INTRAVENOUS | Status: AC
  Administered 2013-08-05: 1000 mL via INTRAVENOUS

## 2013-08-05 MED ORDER — CIPROFLOXACIN HCL 500 MG PO TABS: 500.0000 mg | ORAL_TABLET | Freq: Two times a day (BID) | ORAL | Status: DC

## 2013-08-05 MED ORDER — MORPHINE SULFATE 4 MG/ML IJ SOLN
4.0000 mg | Freq: Once | INTRAMUSCULAR | Status: AC
  Administered 2013-08-05: 4 mg via INTRAVENOUS
  Filled 2013-08-05: qty 1

## 2013-08-05 NOTE — ED Provider Notes (Signed)
CSN: [id number]     Arrival date & time 08/04/13  2054 History   First MD Initiated Contact with Patient 08/04/13 2358     Chief Complaint  Patient presents with  . Back Pain   (Consider location/radiation/quality/duration/timing/severity/associated sxs/prior Treatment) The history is provided by the patient.  Veronica King is a 37 y.o. female history of hypertension here presenting with flank pain and dysuria. She's been having some pain when she urinates as well as decreased urine output for the last week. Since yesterday she developed some right flank pain that is sharp and constant. She took some Motrin with no relief. Today the pain radiated to the left side as well as she had one episode of vomiting. Also had fever 102 yesterday.    Past Medical History  Diagnosis Date  . Hypertension    No past surgical history on file. No family history on file. History  Substance Use Topics  . Smoking status: Current Every Day Smoker  . Smokeless tobacco: Not on file  . Alcohol Use: Yes   OB History   Grav Para Term Preterm Abortions TAB SAB Ect Mult Living                 Review of Systems  Constitutional: Positive for fever.  Gastrointestinal: Positive for abdominal pain.  Musculoskeletal: Positive for back pain.  All other systems reviewed and are negative.    Allergies  Review of patient's allergies indicates no known allergies.  Home Medications   Current Outpatient Rx  Name  Route  Sig  Dispense  Refill  . Cranberry (SM CRANBERRY) 300 MG tablet   Oral   Take 300 mg by mouth daily.          BP 138/78  Pulse 88  Temp(Src) 98.1 F (36.7 C) (Oral)  Resp 18  SpO2 96% Physical Exam  Nursing note and vitals reviewed. Constitutional: She is oriented to person, place, and time. She appears well-developed and well-nourished.  HENT:  Head: Normocephalic.  Mouth/Throat: Oropharynx is clear and moist.  Eyes: Conjunctivae are normal. Pupils are equal, round, and  reactive to light.  Neck: Normal range of motion.  Cardiovascular: Normal rate, regular rhythm and normal heart sounds.   Pulmonary/Chest: Effort normal and breath sounds normal. No respiratory distress. She has no wheezes. She has no rales.  Abdominal: Soft.  + suprapubic tenderness. + R CVAT. Mild periumbilical tenderness and RLQ tenderness as well. No rebound.   Musculoskeletal: Normal range of motion.  Neurological: She is alert and oriented to person, place, and time.  Skin: Skin is warm and dry.  Psychiatric: She has a normal mood and affect. Her behavior is normal. Judgment and thought content normal.    ED Course  Procedures (including critical care time) Labs Review Labs Reviewed  URINALYSIS, ROUTINE W REFLEX MICROSCOPIC - Abnormal; Notable for the following:    APPearance TURBID (*)    Hgb urine dipstick MODERATE (*)    Protein, ur 30 (*)    Leukocytes, UA LARGE (*)    All other components within normal limits  URINE MICROSCOPIC-ADD ON - Abnormal; Notable for the following:    Squamous Epithelial / LPF FEW (*)    Bacteria, UA FEW (*)    All other components within normal limits  COMPREHENSIVE METABOLIC PANEL - Abnormal; Notable for the following:    Albumin 3.4 (*)    Alkaline Phosphatase 121 (*)    GFR calc non Af Amer 88 (*)  All other components within normal limits  URINE CULTURE  CBC WITH DIFFERENTIAL  LIPASE, BLOOD  POCT PREGNANCY, URINE   Imaging Review Ct Abdomen Pelvis Wo Contrast  08/05/2013   CLINICAL DATA:  Right-sided back pain with urinary retention and decreased urinary volumes for 1 week. Rule out stone. Nausea vomiting and diarrhea.  EXAM: CT ABDOMEN AND PELVIS WITHOUT CONTRAST  TECHNIQUE: Multidetector CT imaging of the abdomen and pelvis was performed following the standard protocol without intravenous contrast.  COMPARISON:  None.  FINDINGS: Lung bases: Motion degradation. Grossly clear lungs. Normal heart size without pericardial or pleural  effusion.  Abdomen/Pelvis: Degradation continuing secondary to motion minimally into the abdomen. Normal liver, spleen, stomach, pancreas, gallbladder, biliary tract, adrenal glands.  No renal calculi or hydronephrosis. No hydroureter or ureteric calculi.  No renal calculi or hydronephrosis. Colonic stool burden suggests constipation. Normal terminal ileum and appendix. Normal small bowel without abdominal ascites. No pelvic adenopathy. Normal urinary bladder. Intrauterine device. No adnexal mass. No significant free fluid.  Bones/Musculoskeletal: No acute osseous abnormality.  IMPRESSION: 1.  No urinary tract calculi or hydronephrosis. 2.  Possible constipation. 3. Normal appendix.   Electronically Signed   By: Jeronimo Greaves   On: 08/05/2013 02:23    MDM  No diagnosis found. Veronica King is a 37 y.o. female here with ab pain, flank pain. Likely pyelo vs stone. Will get labs, UA, CT ab/pel.   3:11 AM Labs unremarkable. UA + blood and large leuks. Given ceftriaxone in the ED. CT showed no stone and nl appendix. Pain improved. Will d/c home on cipro, percocet prn, reglan. She likely has pyelo.    Richardean Canal, MD 08/05/13 [phone]

## 2013-08-07 LAB — URINE CULTURE: Colony Count: >=100000

## 2013-09-27 ENCOUNTER — Ambulatory Visit: Payer: No Typology Code available for payment source | Attending: Internal Medicine

## 2013-10-17 ENCOUNTER — Ambulatory Visit: Payer: No Typology Code available for payment source | Attending: Internal Medicine | Admitting: Internal Medicine

## 2013-10-17 ENCOUNTER — Encounter: Payer: Self-pay | Admitting: Internal Medicine

## 2013-10-17 VITALS — BP 160/100 | HR 74 | Temp 98.4°F | Resp 17 | Wt 207.2 lb

## 2013-10-17 DIAGNOSIS — Z139 Encounter for screening, unspecified: Secondary | ICD-10-CM

## 2013-10-17 DIAGNOSIS — I10 Essential (primary) hypertension: Secondary | ICD-10-CM | POA: Insufficient documentation

## 2013-10-17 DIAGNOSIS — F17200 Nicotine dependence, unspecified, uncomplicated: Secondary | ICD-10-CM

## 2013-10-17 DIAGNOSIS — N898 Other specified noninflammatory disorders of vagina: Secondary | ICD-10-CM

## 2013-10-17 LAB — CBC WITH DIFFERENTIAL/PLATELET
Basophils Absolute: 0 10*3/uL (ref 0.0–0.1)
Basophils Relative: 0 % (ref 0–1)
Eosinophils Absolute: 0 10*3/uL (ref 0.0–0.7)
Eosinophils Relative: 1 % (ref 0–5)
HCT: 38.4 % (ref 36.0–46.0)
Hemoglobin: 13.3 g/dL (ref 12.0–15.0)
Lymphocytes Relative: 47 % — ABNORMAL HIGH (ref 12–46)
Lymphs Abs: 2.3 10*3/uL (ref 0.7–4.0)
MCH: 31.4 pg (ref 26.0–34.0)
MCHC: 34.6 g/dL (ref 30.0–36.0)
MCV: 90.6 fL (ref 78.0–100.0)
Monocytes Absolute: 0.3 10*3/uL (ref 0.1–1.0)
Monocytes Relative: 6 % (ref 3–12)
Neutro Abs: 2.2 10*3/uL (ref 1.7–7.7)
Neutrophils Relative %: 46 % (ref 43–77)
Platelets: 264 10*3/uL (ref 150–400)
RBC: 4.24 MIL/uL (ref 3.87–5.11)
RDW: 13.8 % (ref 11.5–15.5)
WBC: 4.9 10*3/uL (ref 4.0–10.5)

## 2013-10-17 LAB — COMPLETE METABOLIC PANEL WITH GFR
ALT: 18 U/L (ref 0–35)
AST: 18 U/L (ref 0–37)
Albumin: 4 g/dL (ref 3.5–5.2)
Alkaline Phosphatase: 112 U/L (ref 39–117)
BUN: 10 mg/dL (ref 6–23)
CO2: 26 mEq/L (ref 19–32)
Calcium: 9.4 mg/dL (ref 8.4–10.5)
Chloride: 104 mEq/L (ref 96–112)
Creat: 0.76 mg/dL (ref 0.50–1.10)
GFR, Est African American: >89 mL/min
GFR, Est Non African American: >89 mL/min
Glucose, Bld: 80 mg/dL (ref 70–99)
Potassium: 4 mEq/L (ref 3.5–5.3)
Sodium: 136 mEq/L (ref 135–145)
Total Bilirubin: 0.3 mg/dL (ref 0.3–1.2)
Total Protein: 7.4 g/dL (ref 6.0–8.3)

## 2013-10-17 LAB — LIPID PANEL
Cholesterol: 163 mg/dL (ref 0–200)
HDL: 46 mg/dL (ref >39)
LDL Cholesterol: 95 mg/dL (ref 0–99)
Total CHOL/HDL Ratio: 3.5 Ratio
Triglycerides: 110 mg/dL (ref <150)
VLDL: 22 mg/dL (ref 0–40)

## 2013-10-17 MED ORDER — NICOTINE 21 MG/24HR TD PT24: 21.0000 mg | MEDICATED_PATCH | Freq: Every day | TRANSDERMAL | Status: DC

## 2013-10-17 MED ORDER — AMLODIPINE BESYLATE 5 MG PO TABS: 5.0000 mg | ORAL_TABLET | Freq: Every day | ORAL | Status: DC

## 2013-10-17 MED ORDER — METRONIDAZOLE 500 MG PO TABS: 500.0000 mg | ORAL_TABLET | Freq: Three times a day (TID) | ORAL | Status: DC

## 2013-10-17 NOTE — Progress Notes (Signed)
Patient here to establish care Was diagnosed with HTN but not on any medicine Has history of BV-having symptoms of foul (fish ) odor And yellowish milky discharge

## 2013-10-17 NOTE — Patient Instructions (Signed)

## 2013-10-17 NOTE — Progress Notes (Signed)
MRN: [id number] Name: Veronica King  Sex: female Age: 37 y.o. DOB: January 02, 1976  Allergies: Review of patient's allergies indicates no known allergies.  Chief Complaint  Patient presents with  . BV  . Establish Care    HPI: Patient is 37 y.o. female who comes for the first time to establish medical care, she has history of hypertension and is not on any blood pressure medication, she also reported to have a general discharge in yellowish in color, patient reported to have history of mitral vaginosis and was treated for that 6 months ago, denies any urinary symptoms denies any nausea vomiting fever chills, patient currently denies any headache dizziness chest pain or shortness of breath.  Past Medical History  Diagnosis Date  . Hypertension     History reviewed. No pertinent past surgical history.    Medication List       This list is accurate as of: 10/17/13  5:30 PM.  Always use your most recent med list.               amLODipine 5 MG tablet  Commonly known as:  NORVASC  Take 1 tablet (5 mg total) by mouth daily.     ciprofloxacin 500 MG tablet  Commonly known as:  CIPRO  Take 1 tablet (500 mg total) by mouth 2 (two) times daily.     metoCLOPramide 10 MG tablet  Commonly known as:  REGLAN  Take 1 tablet (10 mg total) by mouth every 6 (six) hours as needed (nausea/headache).     metroNIDAZOLE 500 MG tablet  Commonly known as:  FLAGYL  Take 1 tablet (500 mg total) by mouth 3 (three) times daily.     nicotine 21 mg/24hr patch  Commonly known as:  NICODERM CQ  Place 1 patch (21 mg total) onto the skin daily.     oxyCODONE-acetaminophen 5-325 MG per tablet  Commonly known as:  PERCOCET  Take 2 tablets by mouth every 6 (six) hours as needed for pain.     SM CRANBERRY 300 MG tablet  Generic drug:  Cranberry  Take 300 mg by mouth daily.        Meds ordered this encounter  Medications  . metroNIDAZOLE (FLAGYL) 500 MG tablet    Sig: Take 1 tablet (500 mg total)  by mouth 3 (three) times daily.    Dispense:  21 tablet    Refill:  0  . amLODipine (NORVASC) 5 MG tablet    Sig: Take 1 tablet (5 mg total) by mouth daily.    Dispense:  90 tablet    Refill:  3  . nicotine (NICODERM CQ) 21 mg/24hr patch    Sig: Place 1 patch (21 mg total) onto the skin daily.    Dispense:  28 patch    Refill:  0     There is no immunization history on file for this patient.  History  Substance Use Topics  . Smoking status: Current Every Day Smoker -- 0.50 packs/day for 1 years  . Smokeless tobacco: Not on file  . Alcohol Use: Yes    Review of Systems  As noted in HPI  Filed Vitals:   10/17/13 1711  BP: 169/126  Pulse: 74  Temp: 98.4 F (36.9 C)  Resp: 17    Physical Exam  Physical Exam  Constitutional: She is oriented to person, place, and time. No distress.  HENT:  Head: Normocephalic and atraumatic.  Eyes: EOM are normal. Pupils are equal, round, and reactive to  light.  Cardiovascular: Normal rate and regular rhythm.   Pulmonary/Chest: Breath sounds normal. No respiratory distress. She has no wheezes. She has no rales.  Musculoskeletal: She exhibits no edema.  Neurological: She is alert and oriented to person, place, and time.    CBC    Component Value Date/Time   WBC 7.4 08/05/2013 0013   RBC 3.98 08/05/2013 0013   HGB 13.1 08/05/2013 0013   HCT 36.5 08/05/2013 0013   PLT 202 08/05/2013 0013   MCV 91.7 08/05/2013 0013   LYMPHSABS 1.6 08/05/2013 0013   MONOABS 0.8 08/05/2013 0013   EOSABS 0.0 08/05/2013 0013   BASOSABS 0.0 08/05/2013 0013    CMP     Component Value Date/Time   NA 141 08/05/2013 0013   K 3.8 08/05/2013 0013   CL 106 08/05/2013 0013   CO2 23 08/05/2013 0013   GLUCOSE 87 08/05/2013 0013   BUN 12 08/05/2013 0013   CREATININE 0.84 08/05/2013 0013   CALCIUM 9.0 08/05/2013 0013   PROT 7.4 08/05/2013 0013   ALBUMIN 3.4* 08/05/2013 0013   AST 21 08/05/2013 0013   ALT 26 08/05/2013 0013   ALKPHOS 121* 08/05/2013 0013   BILITOT 0.4  08/05/2013 0013   GFRNONAA 88* 08/05/2013 0013   GFRAA >90 08/05/2013 0013    Lab Results  Component Value Date/Time   CHOL 188 11/20/2010  9:19 PM    No components found with this basename: hga1c    Lab Results  Component Value Date/Time   AST 21 08/05/2013 12:13 AM    Assessment and Plan  Essential hypertension, benign - Plan: Advised patient for low salt diet, will start patient on amLODipine (NORVASC) 5 MG tablet daily.  Vaginal discharge - Plan: metroNIDAZOLE (FLAGYL) 500 MG tablet  Screening - Plan: CBC with Differential, COMPLETE METABOLIC PANEL WITH GFR, Lipid panel, TSH, Vit D  25 hydroxy (rtn osteoporosis monitoring)  Smoking - Plan: nicotine (NICODERM CQ) 21 mg/24hr patch   Health Maintenance  Patient declined for flu shot uptodate with pap smear   Return in about 6 weeks (around 11/28/2013).  Doris Cheadle, MD

## 2013-10-18 LAB — TSH: TSH: 2.269 u[IU]/mL (ref 0.350–4.500)

## 2013-10-18 MED ORDER — VITAMIN D (ERGOCALCIFEROL) 1.25 MG (50000 UNIT) PO CAPS: 50000.0000 [IU] | ORAL_CAPSULE | ORAL | Status: DC

## 2013-10-18 NOTE — Addendum Note (Signed)
Addended by: Nonnie Done D on: 10/18/2013 01:11 PM   Modules accepted: Orders

## 2013-11-28 ENCOUNTER — Ambulatory Visit: Payer: Self-pay

## 2014-08-02 ENCOUNTER — Encounter (HOSPITAL_COMMUNITY): Payer: Self-pay | Admitting: Emergency Medicine

## 2014-08-02 ENCOUNTER — Emergency Department (HOSPITAL_COMMUNITY)
Admission: EM | Admit: 2014-08-02 | Discharge: 2014-08-03 | Disposition: A | Discharge status: Home / Self Care | Payer: Self-pay | Attending: Emergency Medicine | Admitting: Emergency Medicine

## 2014-08-02 ENCOUNTER — Emergency Department (HOSPITAL_COMMUNITY): Payer: Self-pay

## 2014-08-02 DIAGNOSIS — I10 Essential (primary) hypertension: Secondary | ICD-10-CM | POA: Insufficient documentation

## 2014-08-02 DIAGNOSIS — Z79899 Other long term (current) drug therapy: Secondary | ICD-10-CM | POA: Insufficient documentation

## 2014-08-02 DIAGNOSIS — Z792 Long term (current) use of antibiotics: Secondary | ICD-10-CM | POA: Insufficient documentation

## 2014-08-02 DIAGNOSIS — F17200 Nicotine dependence, unspecified, uncomplicated: Secondary | ICD-10-CM | POA: Insufficient documentation

## 2014-08-02 DIAGNOSIS — J9801 Acute bronchospasm: Secondary | ICD-10-CM | POA: Insufficient documentation

## 2014-08-02 DIAGNOSIS — R0602 Shortness of breath: Secondary | ICD-10-CM | POA: Insufficient documentation

## 2014-08-02 DIAGNOSIS — R000 Tachycardia, unspecified: Secondary | ICD-10-CM | POA: Insufficient documentation

## 2014-08-02 LAB — I-STAT CHEM 8, ED
BUN: 10 mg/dL (ref 6–23)
CHLORIDE: 107 meq/L (ref 96–112)
Calcium, Ion: 1.18 mmol/L (ref 1.12–1.23)
Creatinine, Ser: 0.8 mg/dL (ref 0.50–1.10)
GLUCOSE: 88 mg/dL (ref 70–99)
HCT: 45 % (ref 36.0–46.0)
Hemoglobin: 15.3 g/dL — ABNORMAL HIGH (ref 12.0–15.0)
Potassium: 3.4 mEq/L — ABNORMAL LOW (ref 3.7–5.3)
Sodium: 142 mEq/L (ref 137–147)
TCO2: 21 mmol/L (ref 0–100)

## 2014-08-02 LAB — CBC WITH DIFFERENTIAL/PLATELET
Basophils Absolute: 0 10*3/uL (ref 0.0–0.1)
Basophils Relative: 0 % (ref 0–1)
Eosinophils Absolute: 0 10*3/uL (ref 0.0–0.7)
Eosinophils Relative: 0 % (ref 0–5)
HEMATOCRIT: 38 % (ref 36.0–46.0)
HEMOGLOBIN: 13.8 g/dL (ref 12.0–15.0)
LYMPHS ABS: 1.9 10*3/uL (ref 0.7–4.0)
LYMPHS PCT: 33 % (ref 12–46)
MCH: 31.9 pg (ref 26.0–34.0)
MCHC: 36.3 g/dL — ABNORMAL HIGH (ref 30.0–36.0)
MCV: 87.8 fL (ref 78.0–100.0)
MONO ABS: 0.4 10*3/uL (ref 0.1–1.0)
MONOS PCT: 7 % (ref 3–12)
NEUTROS ABS: 3.5 10*3/uL (ref 1.7–7.7)
Neutrophils Relative %: 60 % (ref 43–77)
Platelets: 252 10*3/uL (ref 150–400)
RBC: 4.33 MIL/uL (ref 3.87–5.11)
RDW: 12.2 % (ref 11.5–15.5)
WBC: 5.8 10*3/uL (ref 4.0–10.5)

## 2014-08-02 LAB — PRO B NATRIURETIC PEPTIDE: PRO B NATRI PEPTIDE: 16.3 pg/mL (ref 0.0–100.0)

## 2014-08-02 LAB — I-STAT TROPONIN, ED: Troponin i, poc: 0 ng/mL (ref 0.00–0.08)

## 2014-08-02 LAB — D-DIMER, QUANTITATIVE: D-Dimer, Quant: <0.27 ug/mL-FEU (ref 0.00–0.48)

## 2014-08-02 MED ORDER — IPRATROPIUM-ALBUTEROL 0.5-2.5 (3) MG/3ML IN SOLN
3.0000 mL | Freq: Once | RESPIRATORY_TRACT | Status: AC
  Administered 2014-08-02: 3 mL via RESPIRATORY_TRACT
  Filled 2014-08-02: qty 3

## 2014-08-02 MED ORDER — ALBUTEROL (5 MG/ML) CONTINUOUS INHALATION SOLN
10.0000 mg/h | INHALATION_SOLUTION | Freq: Once | RESPIRATORY_TRACT | Status: AC
  Administered 2014-08-02: 10 mg/h via RESPIRATORY_TRACT
  Filled 2014-08-02: qty 20

## 2014-08-02 MED ORDER — METHYLPREDNISOLONE SODIUM SUCC 125 MG IJ SOLR
125.0000 mg | Freq: Once | INTRAMUSCULAR | Status: AC
  Administered 2014-08-02: 125 mg via INTRAVENOUS
  Filled 2014-08-02: qty 2

## 2014-08-02 NOTE — ED Provider Notes (Signed)
CSN: [id number]     Arrival date & time 08/02/14  2036 History   First MD Initiated Contact with Patient 08/02/14 2143     Chief Complaint  Patient presents with  . Shortness of Breath     (Consider location/radiation/quality/duration/timing/severity/associated sxs/prior Treatment) HPI Comments: Is a 38 year old female, with a history of hypertension, who presents with several days of shortness of breath, acutely worse in the last 24 hours.  She went to urgent care for her evaluation and sent to the ED for further evaluation.  Per their report, chest x-ray is normal, and EKG, is unremarkable.  Patient was given albuterol and Atrovent treatment.  On arrival to the emergency room with little relief. She is a smoker and does use Depo-Provera as a form of birth control.  She denies any recent travel, swelling to her legs.  Family history of DVTs or PE Patient noncompliant with HTN medication   Patient is a 38 y.o. female presenting with shortness of breath. The history is provided by the patient.  Shortness of Breath Severity:  Moderate Onset quality:  Gradual Duration:  2 days Timing:  Constant Progression:  Worsening Chronicity:  New Relieved by:  Nothing Worsened by:  Activity Associated symptoms: no cough, no fever, no vomiting and no wheezing   Risk factors: no hx of PE/DVT, no obesity, no oral contraceptive use, no prolonged immobilization and no recent surgery     Past Medical History  Diagnosis Date  . Hypertension    History reviewed. No pertinent past surgical history. Family History  Problem Relation Age of Onset  . Cancer Sister   . Cancer Maternal Aunt   . Hypertension Maternal Grandmother   . Drug abuse Maternal Grandmother   . Hypertension Paternal Grandmother   . Drug abuse Paternal Grandmother    History  Substance Use Topics  . Smoking status: Current Every Day Smoker -- 0.30 packs/day for 1 years  . Smokeless tobacco: Not on file  . Alcohol Use: Yes   OB  History   Grav Para Term Preterm Abortions TAB SAB Ect Mult Living                 Review of Systems  Constitutional: Negative for fever.  Respiratory: Positive for chest tightness and shortness of breath. Negative for cough and wheezing.   Gastrointestinal: Negative for nausea and vomiting.  Neurological: Negative for dizziness.  All other systems reviewed and are negative.     Allergies  Review of patient's allergies indicates no known allergies.  Home Medications   Prior to Admission medications   Not on File   BP 137/92  Pulse 107  Temp(Src) 97.8 F (36.6 C) (Oral)  Resp 22  Ht  (1.676 m)  Wt 215 lb (97.523 kg)  BMI 34.72 kg/m2  SpO2 96% Physical Exam  Nursing note and vitals reviewed. Constitutional: She appears well-developed and well-nourished.  HENT:  Head: Normocephalic.  Eyes: Pupils are equal, round, and reactive to light.  Neck: Normal range of motion.  Cardiovascular: Normal rate and regular rhythm.   Pulmonary/Chest: She is in respiratory distress. She has no wheezes. She has no rales. She exhibits tenderness.  Abdominal: Soft.  Musculoskeletal: Normal range of motion.  Neurological: She is alert.  Skin: Skin is warm. No rash noted. No erythema.    ED Course  Procedures (including critical care time) Labs Review Labs Reviewed  CBC WITH DIFFERENTIAL - Abnormal; Notable for the following:    MCHC 36.3 (*)  All other components within normal limits  I-STAT CHEM 8, ED - Abnormal; Notable for the following:    Potassium 3.4 (*)    Hemoglobin 15.3 (*)    All other components within normal limits  D-DIMER, QUANTITATIVE  PRO B NATRIURETIC PEPTIDE  I-STAT TROPOININ, ED    Imaging Review Dg Chest 2 View  08/02/2014   CLINICAL DATA:  Low-grade fevers and shortness of breath beginning 3-7 days ago. Tachypneic.  EXAM: CHEST  2 VIEW  COMPARISON:  10/12/2004  FINDINGS: The heart size and mediastinal contours are within normal limits. Both lungs  are clear. The visualized skeletal structures are unremarkable.  IMPRESSION: No active cardiopulmonary disease.   Electronically Signed   By: Burman Nieves M.D.   On: 08/02/2014 23:15     EKG Interpretation   Date/Time:  Friday August 02 2014 21:53:44 EDT Ventricular Rate:  101 PR Interval:  171 QRS Duration: 93 QT Interval:  336 QTC Calculation: 435 R Axis:   72 Text Interpretation:  Sinus tachycardia Atrial premature complex Consider  left ventricular hypertrophy since last tracing no significant change  Confirmed by Effie Shy  MD, Mechele Collin ([id number]) on 08/02/2014 10:20:21 PM     Patient was evaluated after albuterol, Atrovent, she remains tachypneic and tachycardic barely able to speak to words in a row No longer wheezing observed after hour long neb.  No rebound. Will DC home with Rx fro steroids and inhaler  MDM   Final diagnoses:  Bronchospasm         Arman Filter, NP 08/03/14 0330  Arman Filter, NP 08/03/14 [phone]

## 2014-08-02 NOTE — ED Notes (Signed)
No answer

## 2014-08-02 NOTE — ED Notes (Signed)
Patient is resting comfortably. 

## 2014-08-02 NOTE — ED Notes (Signed)
Nebulizer treatment complete.

## 2014-08-02 NOTE — ED Notes (Signed)
Patient arrives via EMS from urgent care. Was sent here for further evaluation. Patient reports symptoms that began about 7 days ago. States that Veronica King had low grade fevers early in the week and the shortness of breath began about 3 days ago. In triage patient noted to be tachypnic. Per UC paperwork, chest x-ray was normal and EKGs were unremarkable.

## 2014-08-02 NOTE — ED Notes (Signed)
Back from x-ray to A-12.

## 2014-08-02 NOTE — ED Notes (Signed)
Family at bedside. 

## 2014-08-02 NOTE — ED Notes (Signed)
Pt placed on 2L [place]

## 2014-08-02 NOTE — ED Notes (Signed)
Phleb at bedside. Pt c/o being cold. 3 blankets given. Pt shivering and cannot catch her breath. Pt is a/o at this time. Pt voice quality hoarse, but pt denies any fevers or recent illness. Pt boyfriend reporting that pt has night sweats.

## 2014-08-03 MED ORDER — AEROCHAMBER PLUS FLO-VU LARGE MISC
1.0000 | Freq: Once | Status: AC
  Administered 2014-08-03: 1
  Filled 2014-08-03: qty 1

## 2014-08-03 MED ORDER — PREDNISONE 10 MG PO TABS: 20.0000 mg | ORAL_TABLET | Freq: Every day | ORAL | Status: DC

## 2014-08-03 MED ORDER — ALBUTEROL SULFATE HFA 108 (90 BASE) MCG/ACT IN AERS
2.0000 | INHALATION_SPRAY | RESPIRATORY_TRACT | Status: DC | PRN
  Administered 2014-08-03: 2 via RESPIRATORY_TRACT
  Filled 2014-08-03 (×2): qty 6.7

## 2014-08-03 NOTE — ED Notes (Signed)
Patient is alert and orientedx4.  Patient was explained discharge instructions and they understood them with no questions.  The patient's significant other, Loma Messing is taking the patient home.

## 2014-08-03 NOTE — Discharge Instructions (Signed)
Bronchospasm °A bronchospasm is a spasm or tightening of the airways going into the lungs. During a bronchospasm breathing becomes more difficult because the airways get smaller. When this happens there can be coughing, a whistling sound when breathing (wheezing), and difficulty breathing. Bronchospasm is often associated with asthma, but not all patients who experience a bronchospasm have asthma. °CAUSES  °A bronchospasm is caused by inflammation or irritation of the airways. The inflammation or irritation may be triggered by:  °· Allergies (such as to animals, pollen, food, or mold). Allergens that cause bronchospasm may cause wheezing immediately after exposure or many hours later.   °· Infection. Viral infections are believed to be the most common cause of bronchospasm.   °· Exercise.   °· Irritants (such as pollution, cigarette smoke, strong odors, aerosol sprays, and paint fumes).   °· Weather changes. Winds increase molds and pollens in the air. Rain refreshes the air by washing irritants out. Cold air may cause inflammation.   °· Stress and emotional upset.   °SIGNS AND SYMPTOMS  °· Wheezing.   °· Excessive nighttime coughing.   °· Frequent or severe coughing with a simple cold.   °· Chest tightness.   °· Shortness of breath.   °DIAGNOSIS  °Bronchospasm is usually diagnosed through a history and physical exam. Tests, such as chest X-rays, are sometimes done to look for other conditions. °TREATMENT  °· Inhaled medicines can be given to open up your airways and help you breathe. The medicines can be given using either an inhaler or a nebulizer machine. °· Corticosteroid medicines may be given for severe bronchospasm, usually when it is associated with asthma. °HOME CARE INSTRUCTIONS  °· Always have a plan prepared for seeking medical care. Know when to call your health care provider and local emergency services (911 in the [place] Know where you can access local emergency care. °· Only take medicines as  directed by your health care provider. °· If you were prescribed an inhaler or nebulizer machine, ask your health care provider to explain how to use it correctly. Always use a spacer with your inhaler if you were given one. °· It is necessary to remain calm during an attack. Try to relax and breathe more slowly.  °· Control your home environment in the following ways:   °¨ Change your heating and air conditioning filter at least once a month.   °¨ Limit your use of fireplaces and wood stoves. °¨ Do not smoke and do not allow smoking in your home.   °¨ Avoid exposure to perfumes and fragrances.   °¨ Get rid of pests (such as roaches and mice) and their droppings.   °¨ Throw away plants if you see mold on them.   °¨ Keep your house clean and dust free.   °¨ Replace carpet with wood, tile, or vinyl flooring. Carpet can trap dander and dust.   °¨ Use allergy-proof pillows, mattress covers, and box spring covers.   °¨ Wash bed sheets and blankets every week in hot water and dry them in a dryer.   °¨ Use blankets that are made of polyester or cotton.   °¨ Wash hands frequently. °SEEK MEDICAL CARE IF:  °· You have muscle aches.   °· You have chest pain.   °· The sputum changes from clear or white to yellow, green, gray, or bloody.   °· The sputum you cough up gets thicker.   °· There are problems that may be related to the medicine you are given, such as a rash, itching, swelling, or trouble breathing.   °SEEK IMMEDIATE MEDICAL CARE IF:  °· You have worsening wheezing and coughing even   after taking your prescribed medicines.   You have increased difficulty breathing.   You develop severe chest pain. MAKE SURE YOU:   Understand these instructions.  Will watch your condition.  Will get help right away if you are not doing well or get worse. Document Released: 10/28/2003 Document Revised: 10/30/2013 Document Reviewed: 04/16/2013 [facility] Patient Information 2015 [place], [place]. This information is not  intended to replace advice given to you by your health care provider. Make sure you discuss any questions you have with your health care provider. You have been given a prescription for prednisone.  Please fill this and start taking it.  At bedtime.  For the next 5 days.  You've also been provided with an inhaler and AeroChamber.  Please uses as follows 2 puffs every 4-6 hours while awake for the next 2 days, then as needed.  I also recommended for make an appointment with your physician, for followup

## 2014-08-03 NOTE — ED Notes (Signed)
Patient is resting comfortably. 

## 2014-08-03 NOTE — ED Provider Notes (Signed)
  Face-to-face evaluation   History: She reports shortness of breath that began today. She denies a history of asthma or bronchitis. He has had low-grade fever for several days.  Physical exam: Alert, calm, cooperative. Lungs with mild, diminished air movement, but no wheezes, rales, or rhonchi.  Evaluation 00:15- she reports feeling better after treatment administered.  Medical screening examination/treatment/procedure(s) were conducted as a shared visit with non-physician practitioner(s) and myself.  I personally evaluated the patient during the encounter  Flint Melter, MD 08/04/14 1140

## 2014-10-01 ENCOUNTER — Emergency Department (HOSPITAL_COMMUNITY): Payer: No Typology Code available for payment source

## 2014-10-01 ENCOUNTER — Emergency Department (HOSPITAL_COMMUNITY)
Admission: EM | Admit: 2014-10-01 | Discharge: 2014-10-01 | Disposition: A | Discharge status: Home / Self Care | Payer: No Typology Code available for payment source | Attending: Emergency Medicine | Admitting: Emergency Medicine

## 2014-10-01 ENCOUNTER — Encounter (HOSPITAL_COMMUNITY): Payer: Self-pay

## 2014-10-01 DIAGNOSIS — Z79899 Other long term (current) drug therapy: Secondary | ICD-10-CM | POA: Diagnosis not present

## 2014-10-01 DIAGNOSIS — M546 Pain in thoracic spine: Secondary | ICD-10-CM | POA: Insufficient documentation

## 2014-10-01 DIAGNOSIS — I10 Essential (primary) hypertension: Secondary | ICD-10-CM | POA: Insufficient documentation

## 2014-10-01 DIAGNOSIS — Z7952 Long term (current) use of systemic steroids: Secondary | ICD-10-CM | POA: Diagnosis not present

## 2014-10-01 DIAGNOSIS — M549 Dorsalgia, unspecified: Secondary | ICD-10-CM

## 2014-10-01 DIAGNOSIS — Z791 Long term (current) use of non-steroidal anti-inflammatories (NSAID): Secondary | ICD-10-CM | POA: Insufficient documentation

## 2014-10-01 DIAGNOSIS — Z3202 Encounter for pregnancy test, result negative: Secondary | ICD-10-CM | POA: Insufficient documentation

## 2014-10-01 DIAGNOSIS — Z720 Tobacco use: Secondary | ICD-10-CM | POA: Diagnosis not present

## 2014-10-01 LAB — URINALYSIS, ROUTINE W REFLEX MICROSCOPIC
Bilirubin Urine: NEGATIVE
Glucose, UA: NEGATIVE mg/dL
Hgb urine dipstick: NEGATIVE
Ketones, ur: NEGATIVE mg/dL
Nitrite: NEGATIVE
Protein, ur: NEGATIVE mg/dL
Specific Gravity, Urine: 1.026 (ref 1.005–1.030)
UROBILINOGEN UA: 1 mg/dL (ref 0.0–1.0)
pH: 6.5 (ref 5.0–8.0)

## 2014-10-01 LAB — URINE MICROSCOPIC-ADD ON

## 2014-10-01 LAB — PREGNANCY, URINE: PREG TEST UR: NEGATIVE

## 2014-10-01 MED ORDER — NAPROXEN 500 MG PO TABS: 500.0000 mg | ORAL_TABLET | Freq: Two times a day (BID) | ORAL | Status: DC

## 2014-10-01 MED ORDER — KETOROLAC TROMETHAMINE 60 MG/2ML IM SOLN
30.0000 mg | Freq: Once | INTRAMUSCULAR | Status: AC
  Administered 2014-10-01: 30 mg via INTRAMUSCULAR
  Filled 2014-10-01: qty 2

## 2014-10-01 MED ORDER — DIAZEPAM 5 MG PO TABS
5.0000 mg | ORAL_TABLET | Freq: Once | ORAL | Status: AC
  Administered 2014-10-01: 5 mg via ORAL
  Filled 2014-10-01: qty 1

## 2014-10-01 MED ORDER — DIAZEPAM 5 MG PO TABS: 5.0000 mg | ORAL_TABLET | Freq: Two times a day (BID) | ORAL | Status: AC

## 2014-10-01 NOTE — ED Provider Notes (Signed)
CSN: [id number]     Arrival date & time 10/01/14  1329 History   First MD Initiated Contact with Patient 10/01/14 1506     Chief Complaint  Patient presents with  . Back Pain    Veronica King is a 38 y.o. female with history of hypertension presents to the emergency department complaining of left mid back pain for the past 5 days. She reports pain certified days ago but has become worse in the past 2 days. She describes the pain as a dull ache and rates it an 8 out of 10. She reports it feels like her muscles are in spasm. She reports her pain is worse with movement, walking and touching her back. She denies trauma to her back. She reports taking Motrin at 9 AM this morning without relief. Patient has fevers, chills, numbness, tingling, dysuria, hematuria, abdominal pain, chest pain, shortness of breath, difficult walking, dizziness, loss of bowel or bladder control or lightheadedness. She denies history of cancer or IV drug use. No hx of back surgery.   (Consider location/radiation/quality/duration/timing/severity/associated sxs/prior Treatment) HPI  Past Medical History  Diagnosis Date  . Hypertension    No past surgical history on file. Family History  Problem Relation Age of Onset  . Cancer Sister   . Cancer Maternal Aunt   . Hypertension Maternal Grandmother   . Drug abuse Maternal Grandmother   . Hypertension Paternal Grandmother   . Drug abuse Paternal Grandmother    History  Substance Use Topics  . Smoking status: Current Every Day Smoker -- 0.30 packs/day for 1 years  . Smokeless tobacco: Not on file  . Alcohol Use: Yes   OB History    No data available     Review of Systems  Constitutional: Negative for fever, chills and fatigue.  HENT: Negative for congestion and sore throat.   Eyes: Negative for redness and visual disturbance.  Respiratory: Negative for cough, shortness of breath and wheezing.   Cardiovascular: Negative for chest pain and palpitations.   Gastrointestinal: Negative for nausea, vomiting, abdominal pain, diarrhea and blood in stool.  Genitourinary: Negative for dysuria, hematuria, vaginal bleeding, difficulty urinating and menstrual problem.  Musculoskeletal: Positive for back pain. Negative for gait problem, neck pain and neck stiffness.  Skin: Negative for rash.  Neurological: Negative for weakness, light-headedness and headaches.  All other systems reviewed and are negative.     Allergies  Review of patient's allergies indicates no known allergies.  Home Medications   Prior to Admission medications   Medication Sig Start Date End Date Taking? Authorizing Provider  diazepam (VALIUM) 5 MG tablet Take 1 tablet (5 mg total) by mouth 2 (two) times daily. 10/01/14   Einar Gip Karin Pinedo, PA-C  naproxen (NAPROSYN) 500 MG tablet Take 1 tablet (500 mg total) by mouth 2 (two) times daily with a meal. 10/01/14   Einar Gip Floreine Kingdon, PA-C  predniSONE (DELTASONE) 10 MG tablet Take 2 tablets (20 mg total) by mouth daily. 08/03/14   Arman Filter, NP   BP 147/95 mmHg  Pulse 85  Temp(Src) 98.1 F (36.7 C) (Oral)  Resp 18  Ht 5' 6.5" (1.689 m)  Wt 214 lb (97.07 kg)  BMI 34.03 kg/m2  SpO2 96% Physical Exam  Constitutional: She appears well-developed and well-nourished. No distress.  HENT:  Head: Normocephalic and atraumatic.  Mouth/Throat: Oropharynx is clear and moist.  Eyes: Conjunctivae are normal. Pupils are equal, round, and reactive to light. Right eye exhibits no discharge. Left eye exhibits  no discharge.  Neck: Neck supple.  Cardiovascular: Normal rate, regular rhythm, normal heart sounds and intact distal pulses.  Exam reveals no gallop and no friction rub.   No murmur heard. Pulmonary/Chest: Effort normal and breath sounds normal. No respiratory distress. She has no wheezes. She has no rales.  Abdominal: Soft. Bowel sounds are normal. She exhibits no distension and no mass. There is no tenderness. There is no  rebound and no guarding.  Musculoskeletal: She exhibits tenderness. She exhibits no edema.  Tenderness to palpation of the patients left mid back musculature. No bony point tenderness. No edema, erythema, crepitus, step-offs or deformity noted. Left musculature feels to be in spasm.  Patient is spontaneously moving all extremities in a coordinated fashion exhibiting good strength. Bilateral patellar DTRs are intact. Sensation is intact in her bilateral upper and lower extremities. Bilateral posterior tibial pulses intact. No lower extremity edema. Patient is able to ambulate without assistance.  Lymphadenopathy:    She has no cervical adenopathy.  Neurological: She is alert. Coordination normal.  Skin: Skin is warm and dry. No rash noted. She is not diaphoretic. No erythema. No pallor.  Psychiatric: She has a normal mood and affect. Her behavior is normal.  Nursing note and vitals reviewed.   ED Course  Procedures (including critical care time) Labs Review Labs Reviewed  URINALYSIS, ROUTINE W REFLEX MICROSCOPIC - Abnormal; Notable for the following:    APPearance HAZY (*)    Leukocytes, UA SMALL (*)    All other components within normal limits  URINE MICROSCOPIC-ADD ON - Abnormal; Notable for the following:    Squamous Epithelial / LPF FEW (*)    All other components within normal limits  PREGNANCY, URINE    Imaging Review Dg Chest 2 View  10/01/2014   CLINICAL DATA:  Left-sided mid back pain for 5 days.  EXAM: CHEST  2 VIEW  COMPARISON:  Chest x-ray dated 08/02/2014  FINDINGS: The heart size and mediastinal contours are within normal limits. Both lungs are clear. The visualized skeletal structures are unremarkable.  IMPRESSION: Normal chest.   Electronically Signed   By: Geanie CooleyJim  Maxwell M.D.   On: 10/01/2014 15:33     EKG Interpretation None      Filed Vitals:   10/01/14 1348 10/01/14 1500 10/01/14 1600 10/01/14 1630  BP: 142/102 137/99 151/100 147/95  Pulse: 88 80 84 85   Temp: 98.1 F (36.7 C)     TempSrc: Oral     Resp: 18     Height: 5' 6.5" (1.689 m)     Weight: 214 lb (97.07 kg)     SpO2: 99% 98% 100% 96%     MDM   Meds given in ED:  Medications  ketorolac (TORADOL) injection 30 mg (30 mg Intramuscular Given 10/01/14 1541)  diazepam (VALIUM) tablet 5 mg (5 mg Oral Given 10/01/14 1546)    Discharge Medication List as of 10/01/2014  4:54 PM    START taking these medications   Details  diazepam (VALIUM) 5 MG tablet Take 1 tablet (5 mg total) by mouth 2 (two) times daily., Starting 10/01/2014, Until Discontinued, Print    naproxen (NAPROSYN) 500 MG tablet Take 1 tablet (500 mg total) by mouth 2 (two) times daily with a meal., Starting 10/01/2014, Until Discontinued, Print        Final diagnoses:  Left-sided thoracic back pain   Ina HomesJamie T Bok is a 38 y.o. female with history of hypertension presents to the emergency department complaining of left  mid back pain for the past 5 days. Patient's left mid back muscular seems to be in spasm. Denies trauma to her back. Patient denies history of back surgery, IV drug use or cancer. She denies loss of bowel or bladder control. She has no urinary symptoms. Patient's urinalysis is unremarkable. The patient's chest x-ray is unremarkable. The patient reports almost complete relief of her pain with Valium and Toradol given in the ED. Patient is able to walk in the patient's room without difficulty or assistance. We'll discharge her Valium and Naprosyn for pain control. Education provided on back pain red flags and when she would need to return to the ED. I advised patient to follow-up with her primary care physician this week. Advised patient return to the ED with new or worsening symptoms or new concerns. Patient verbalized understanding and agreement with plan.  Patient was discussed with and evaluated by Dr. Jeraldine LootsLockwood who agrees with assessment and plan.      Lawana ChambersWilliam Duncan Ceaira Ernster, PA-C 10/01/14  1903  Gerhard Munchobert Lockwood, MD 10/02/14 [phone]

## 2014-10-01 NOTE — ED Notes (Addendum)
Mid upper back pain began on Sunday,  pain is becoming worse.    Taking a deep breath increases the pain, skin is warm and dry.  Pt. Denies any chest pain.  Pain increases when she moves her head back and forth or any movement.l

## 2014-10-01 NOTE — Discharge Instructions (Signed)

## 2015-05-27 ENCOUNTER — Encounter (HOSPITAL_COMMUNITY): Payer: Self-pay | Admitting: Neurology

## 2015-05-27 ENCOUNTER — Emergency Department (HOSPITAL_COMMUNITY)
Admission: EM | Admit: 2015-05-27 | Discharge: 2015-05-27 | Disposition: A | Discharge status: Home / Self Care | Payer: 59 | Attending: Emergency Medicine | Admitting: Emergency Medicine

## 2015-05-27 DIAGNOSIS — Z79899 Other long term (current) drug therapy: Secondary | ICD-10-CM | POA: Insufficient documentation

## 2015-05-27 DIAGNOSIS — Y998 Other external cause status: Secondary | ICD-10-CM | POA: Insufficient documentation

## 2015-05-27 DIAGNOSIS — Y9289 Other specified places as the place of occurrence of the external cause: Secondary | ICD-10-CM | POA: Insufficient documentation

## 2015-05-27 DIAGNOSIS — I10 Essential (primary) hypertension: Secondary | ICD-10-CM | POA: Insufficient documentation

## 2015-05-27 DIAGNOSIS — Z791 Long term (current) use of non-steroidal anti-inflammatories (NSAID): Secondary | ICD-10-CM | POA: Diagnosis not present

## 2015-05-27 DIAGNOSIS — T783XXA Angioneurotic edema, initial encounter: Secondary | ICD-10-CM | POA: Insufficient documentation

## 2015-05-27 DIAGNOSIS — Y9389 Activity, other specified: Secondary | ICD-10-CM | POA: Diagnosis not present

## 2015-05-27 DIAGNOSIS — X58XXXA Exposure to other specified factors, initial encounter: Secondary | ICD-10-CM | POA: Insufficient documentation

## 2015-05-27 DIAGNOSIS — Z720 Tobacco use: Secondary | ICD-10-CM | POA: Insufficient documentation

## 2015-05-27 DIAGNOSIS — R220 Localized swelling, mass and lump, head: Secondary | ICD-10-CM | POA: Diagnosis present

## 2015-05-27 MED ORDER — DEXAMETHASONE SODIUM PHOSPHATE 10 MG/ML IJ SOLN
10.0000 mg | Freq: Once | INTRAMUSCULAR | Status: AC
  Administered 2015-05-27: 10 mg via INTRAMUSCULAR
  Filled 2015-05-27: qty 1

## 2015-05-27 MED ORDER — DIPHENHYDRAMINE HCL 25 MG PO CAPS
50.0000 mg | ORAL_CAPSULE | Freq: Once | ORAL | Status: AC
  Administered 2015-05-27: 50 mg via ORAL
  Filled 2015-05-27: qty 2

## 2015-05-27 MED ORDER — PREDNISONE 20 MG PO TABS: ORAL_TABLET | ORAL | Status: DC

## 2015-05-27 MED ORDER — FAMOTIDINE 20 MG PO TABS
20.0000 mg | ORAL_TABLET | Freq: Once | ORAL | Status: AC
  Administered 2015-05-27: 20 mg via ORAL
  Filled 2015-05-27: qty 1

## 2015-05-27 NOTE — Discharge Instructions (Signed)
It is important for you to take your medications as directed. Please follow-up with your primary care for further evaluation and management of your symptoms. Return to ED for new or worsening symptoms.

## 2015-05-27 NOTE — ED Notes (Signed)
Pt reports swelling to upper lip since Saturday. Is taking benadryl. No respiratory distress, airway intact.

## 2015-05-27 NOTE — ED Provider Notes (Signed)
CSN: [id number]     Arrival date & time 05/27/15  1757 History  This chart was scribed for Joycie Peek, PA-C, working with Margarita Grizzle, MD by Chestine Spore, ED Scribe. The patient was seen in room TR09C/TR09C at 7:37 PM.    Chief Complaint  Patient presents with  . Oral Swelling      The history is provided by the patient. No language interpreter was used.    HPI Comments: Veronica King is a 39 y.o. female who presents to the Emergency Department complaining of upper lip oral swelling onset 3 days ago. 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 Pt notes that her bottom lip feels tight but is not swollen. Denies new medications/environmental areas/foods/soaps/lotions/bee stings. She states that she has tried benadryl with mild relief for her symptoms. She denies SOB, abdominal pain, rash, n/v, and any other symptoms. Denies allergies that she knows of. Pt reports that she does take lisinopril that she has been on for awhile for her HTN (for over a year. Pt last took her HTN medications on last Thursday and her symptoms began on Thursday. No other aggravating or modifying factors.   Past Medical History  Diagnosis Date  . Hypertension    History reviewed. No pertinent past surgical history. Family History  Problem Relation Age of Onset  . Cancer Sister   . Cancer Maternal Aunt   . Hypertension Maternal Grandmother   . Drug abuse Maternal Grandmother   . Hypertension Paternal Grandmother   . Drug abuse Paternal Grandmother    History  Substance Use Topics  . Smoking status: Current Every Day Smoker -- 0.30 packs/day for 1 years  . Smokeless tobacco: Not on file  . Alcohol Use: Yes   OB History    No data available     Review of Systems  Respiratory: Negative for shortness of breath.   Gastrointestinal: Negative for nausea, vomiting and abdominal pain.  Skin: Negative for rash.      Allergies  Review of  patient's allergies indicates no known allergies.  Home Medications   Prior to Admission medications   Medication Sig Start Date End Date Taking? Authorizing Provider  diazepam (VALIUM) 5 MG tablet Take 1 tablet (5 mg total) by mouth 2 (two) times daily. 10/01/14   Everlene Farrier, PA-C  naproxen (NAPROSYN) 500 MG tablet Take 1 tablet (500 mg total) by mouth 2 (two) times daily with a meal. 10/01/14   Everlene Farrier, PA-C  predniSONE (DELTASONE) 20 MG tablet 3 tabs po day one, then 2 tabs daily x 4 days 05/27/15   Joycie Peek, PA-C   BP 147/100 mmHg  Pulse 73  Temp(Src) 98.4 F (36.9 C) (Oral)  Resp 16  SpO2 100% Physical Exam  Constitutional: She is oriented to person, place, and time. She appears well-developed and well-nourished. No distress.  HENT:  Head: Normocephalic and atraumatic.  Mouth/Throat: Oropharynx is clear and moist.  Mild Angioedema to upper lip. Oropharynx otherwise clear and moist. Patent airway. Tolerates secretions well. No evidence of airway compromise.  Eyes: EOM are normal.  Neck: Neck supple. No tracheal deviation present.  Cardiovascular: Normal rate, regular rhythm and normal heart sounds.  Exam reveals no gallop and no friction rub.   No murmur heard. Pulmonary/Chest: Effort normal and breath sounds normal. No respiratory distress. She has no wheezes. She has no rales.  Abdominal: Soft. There is no tenderness.  Musculoskeletal: Normal range of  motion.  Neurological: She is alert and oriented to person, place, and time.  Skin: Skin is warm and dry.  Psychiatric: She has a normal mood and affect. Her behavior is normal.  Nursing note and vitals reviewed.   ED Course  Procedures (including critical care time) DIAGNOSTIC STUDIES: Oxygen Saturation is 97% on RA, nl by my interpretation.    COORDINATION OF CARE: 7:44 PM-Discussed treatment plan with pt at bedside and pt agreed to plan.   Labs Review Labs Reviewed - No data to display  Imaging  Review No results found.   EKG Interpretation None     Meds given in ED:  Medications  dexamethasone (DECADRON) injection 10 mg (10 mg Intramuscular Given 05/27/15 2001)  diphenhydrAMINE (BENADRYL) capsule 50 mg (50 mg Oral Given 05/27/15 2037)  famotidine (PEPCID) tablet 20 mg (20 mg Oral Given 05/27/15 2000)    Discharge Medication List as of 05/27/2015  8:34 PM     Filed Vitals:   05/27/15 1859 05/27/15 2035  BP: 151/101 147/100  Pulse: 91 73  Temp: 99.2 F (37.3 C) 98.4 F (36.9 C)  TempSrc: Oral Oral  Resp: 16 16  SpO2: 97% 100%    MDM  Vitals stable - WNL -afebrile Pt resting comfortably in ED. reports symptoms have improved with administration of steroid in the ED. PE--physical exam shows only mild upper lip angioedema. No evidence of airway compromise. No respiratory distress, no evidence of anaphylaxis.  Will DC with short course of prednisone and instructions to follow-up with primary care for further evaluation and management of symptoms as well as reconciliation of lisinopril medication. We'll continue to take Benadryl at home. No evidence of other acute or emergent pathology at this time. I discussed all relevant lab findings and imaging results with pt and they verbalized understanding. Discussed f/u with PCP within 48 hrs and return precautions, pt very amenable to plan.  Final diagnoses:  Swelling of upper lip    I personally performed the services described in this documentation, which was scribed in my presence. The recorded information has been reviewed and is accurate.    Joycie PeekBenjamin Stanislaus Kaltenbach, PA-C 05/28/15 [id number]  Margarita Grizzleanielle Ray, MD 05/29/15 [id number]

## 2017-05-01 ENCOUNTER — Encounter (HOSPITAL_COMMUNITY): Payer: Self-pay

## 2017-05-01 ENCOUNTER — Emergency Department (HOSPITAL_COMMUNITY)
Admission: EM | Admit: 2017-05-01 | Discharge: 2017-05-01 | Disposition: A | Discharge status: Home / Self Care | Payer: No Typology Code available for payment source | Attending: Emergency Medicine | Admitting: Emergency Medicine

## 2017-05-01 ENCOUNTER — Emergency Department (HOSPITAL_COMMUNITY): Payer: No Typology Code available for payment source

## 2017-05-01 DIAGNOSIS — F17210 Nicotine dependence, cigarettes, uncomplicated: Secondary | ICD-10-CM | POA: Diagnosis not present

## 2017-05-01 DIAGNOSIS — S161XXA Strain of muscle, fascia and tendon at neck level, initial encounter: Secondary | ICD-10-CM | POA: Diagnosis not present

## 2017-05-01 DIAGNOSIS — Z79899 Other long term (current) drug therapy: Secondary | ICD-10-CM | POA: Insufficient documentation

## 2017-05-01 DIAGNOSIS — Y92410 Unspecified street and highway as the place of occurrence of the external cause: Secondary | ICD-10-CM | POA: Diagnosis not present

## 2017-05-01 DIAGNOSIS — S39012A Strain of muscle, fascia and tendon of lower back, initial encounter: Secondary | ICD-10-CM

## 2017-05-01 DIAGNOSIS — Y939 Activity, unspecified: Secondary | ICD-10-CM | POA: Insufficient documentation

## 2017-05-01 DIAGNOSIS — Y998 Other external cause status: Secondary | ICD-10-CM | POA: Diagnosis not present

## 2017-05-01 DIAGNOSIS — S199XXA Unspecified injury of neck, initial encounter: Secondary | ICD-10-CM | POA: Diagnosis present

## 2017-05-01 DIAGNOSIS — I10 Essential (primary) hypertension: Secondary | ICD-10-CM | POA: Insufficient documentation

## 2017-05-01 LAB — POC URINE PREG, ED
PREG TEST UR: NEGATIVE
Preg Test, Ur: NEGATIVE

## 2017-05-01 MED ORDER — NAPROXEN 375 MG PO TABS: 375.0000 mg | ORAL_TABLET | Freq: Two times a day (BID) | ORAL | 0 refills | Status: DC

## 2017-05-01 MED ORDER — DIAZEPAM 5 MG PO TABS
5.0000 mg | ORAL_TABLET | Freq: Once | ORAL | Status: AC
  Administered 2017-05-01: 5 mg via ORAL
  Filled 2017-05-01: qty 1

## 2017-05-01 MED ORDER — CYCLOBENZAPRINE HCL 10 MG PO TABS: 10.0000 mg | ORAL_TABLET | Freq: Three times a day (TID) | ORAL | 0 refills | Status: DC | PRN

## 2017-05-01 MED ORDER — KETOROLAC TROMETHAMINE 30 MG/ML IJ SOLN
30.0000 mg | Freq: Once | INTRAMUSCULAR | Status: AC
  Administered 2017-05-01: 30 mg via INTRAMUSCULAR
  Filled 2017-05-01: qty 1
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
## 2017-05-01 NOTE — ED Notes (Signed)
Patient transported to X-ray 
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
## 2017-05-06 ENCOUNTER — Emergency Department (HOSPITAL_COMMUNITY)
Admission: EM | Admit: 2017-05-06 | Discharge: 2017-05-06 | Disposition: A | Discharge status: Home / Self Care | Payer: BLUE CROSS/BLUE SHIELD | Attending: Emergency Medicine | Admitting: Emergency Medicine

## 2017-05-06 ENCOUNTER — Encounter (HOSPITAL_COMMUNITY): Payer: Self-pay | Admitting: Emergency Medicine

## 2017-05-06 DIAGNOSIS — R51 Headache: Secondary | ICD-10-CM | POA: Diagnosis present

## 2017-05-06 DIAGNOSIS — Z5321 Procedure and treatment not carried out due to patient leaving prior to being seen by health care provider: Secondary | ICD-10-CM | POA: Insufficient documentation
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
## 2017-05-06 NOTE — ED Notes (Signed)
Pt states she does not want to wait. Encouraged pt to stay.

## 2017-05-07 ENCOUNTER — Encounter (HOSPITAL_COMMUNITY): Payer: Self-pay | Admitting: *Deleted

## 2017-05-07 ENCOUNTER — Emergency Department (HOSPITAL_COMMUNITY)
Admission: EM | Admit: 2017-05-07 | Discharge: 2017-05-07 | Disposition: A | Discharge status: Home / Self Care | Payer: BLUE CROSS/BLUE SHIELD | Attending: Emergency Medicine | Admitting: Emergency Medicine

## 2017-05-07 DIAGNOSIS — Z79899 Other long term (current) drug therapy: Secondary | ICD-10-CM | POA: Diagnosis not present

## 2017-05-07 DIAGNOSIS — M542 Cervicalgia: Secondary | ICD-10-CM | POA: Insufficient documentation

## 2017-05-07 DIAGNOSIS — F17210 Nicotine dependence, cigarettes, uncomplicated: Secondary | ICD-10-CM | POA: Insufficient documentation

## 2017-05-07 DIAGNOSIS — R519 Headache, unspecified: Secondary | ICD-10-CM

## 2017-05-07 DIAGNOSIS — R51 Headache: Secondary | ICD-10-CM

## 2017-05-07 DIAGNOSIS — I10 Essential (primary) hypertension: Secondary | ICD-10-CM | POA: Insufficient documentation

## 2017-05-07 DIAGNOSIS — T148XXA Other injury of unspecified body region, initial encounter: Secondary | ICD-10-CM

## 2017-05-07 MED ORDER — METHOCARBAMOL 500 MG PO TABS: 500.0000 mg | ORAL_TABLET | Freq: Two times a day (BID) | ORAL | 0 refills | Status: AC

## 2017-05-07 MED ORDER — HYDROCODONE-ACETAMINOPHEN 5-325 MG PO TABS
2.0000 | ORAL_TABLET | Freq: Once | ORAL | Status: AC
  Administered 2017-05-07: 2 via ORAL
  Filled 2017-05-07: qty 2
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
## 2017-05-07 NOTE — Discharge Instructions (Signed)
Follow-up with her primary care doctor in 2 days for further evaluation.  Take the medication as directed.  Take Tylenol for the headache.  Return to the emergency department for any worsening pain, difficulty walking, numbness/weakness of her arms or legs, urinary or bowel accidents, difficulty breathing or chest pain or any other worsening or concerning symptoms.

## 2019-09-06 ENCOUNTER — Encounter (HOSPITAL_COMMUNITY): Payer: Self-pay | Admitting: *Deleted

## 2019-09-06 ENCOUNTER — Emergency Department (HOSPITAL_COMMUNITY): Payer: Worker's Compensation

## 2019-09-06 ENCOUNTER — Emergency Department (HOSPITAL_COMMUNITY)
Admission: EM | Admit: 2019-09-06 | Discharge: 2019-09-06 | Disposition: A | Discharge status: Home / Self Care | Payer: Worker's Compensation | Attending: Emergency Medicine | Admitting: Emergency Medicine

## 2019-09-06 DIAGNOSIS — F17210 Nicotine dependence, cigarettes, uncomplicated: Secondary | ICD-10-CM | POA: Insufficient documentation

## 2019-09-06 DIAGNOSIS — Y999 Unspecified external cause status: Secondary | ICD-10-CM | POA: Diagnosis not present

## 2019-09-06 DIAGNOSIS — M25551 Pain in right hip: Secondary | ICD-10-CM | POA: Insufficient documentation

## 2019-09-06 DIAGNOSIS — M79651 Pain in right thigh: Secondary | ICD-10-CM | POA: Insufficient documentation

## 2019-09-06 DIAGNOSIS — Z79899 Other long term (current) drug therapy: Secondary | ICD-10-CM | POA: Diagnosis not present

## 2019-09-06 DIAGNOSIS — R079 Chest pain, unspecified: Secondary | ICD-10-CM | POA: Diagnosis not present

## 2019-09-06 DIAGNOSIS — Y9389 Activity, other specified: Secondary | ICD-10-CM | POA: Insufficient documentation

## 2019-09-06 DIAGNOSIS — Y92410 Unspecified street and highway as the place of occurrence of the external cause: Secondary | ICD-10-CM | POA: Insufficient documentation

## 2019-09-06 DIAGNOSIS — I10 Essential (primary) hypertension: Secondary | ICD-10-CM | POA: Diagnosis not present

## 2019-09-06 DIAGNOSIS — S39012A Strain of muscle, fascia and tendon of lower back, initial encounter: Secondary | ICD-10-CM | POA: Diagnosis present

## 2019-09-06 DIAGNOSIS — S0990XA Unspecified injury of head, initial encounter: Secondary | ICD-10-CM | POA: Insufficient documentation

## 2019-09-06 LAB — POC URINE PREG, ED: Preg Test, Ur: NEGATIVE

## 2019-09-06 MED ORDER — CYCLOBENZAPRINE HCL 10 MG PO TABS: 10.0000 mg | ORAL_TABLET | Freq: Three times a day (TID) | ORAL | 0 refills | Status: AC | PRN

## 2019-09-06 MED ORDER — NAPROXEN 500 MG PO TABS: 500.0000 mg | ORAL_TABLET | Freq: Two times a day (BID) | ORAL | 0 refills | Status: AC | PRN

## 2019-09-06 MED ORDER — OXYCODONE-ACETAMINOPHEN 5-325 MG PO TABS
1.0000 | ORAL_TABLET | Freq: Once | ORAL | Status: AC
  Administered 2019-09-06: 17:00:00 1 via ORAL
  Filled 2019-09-06: qty 1

## 2019-09-06 MED ORDER — KETOROLAC TROMETHAMINE 10 MG PO TABS
10.0000 mg | ORAL_TABLET | Freq: Once | ORAL | Status: AC
  Administered 2019-09-06: 10 mg via ORAL
  Filled 2019-09-06: qty 1
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
## 2019-12-19 ENCOUNTER — Encounter (HOSPITAL_COMMUNITY): Payer: Self-pay | Admitting: Emergency Medicine

## 2019-12-19 ENCOUNTER — Emergency Department (HOSPITAL_COMMUNITY)
Admission: EM | Admit: 2019-12-19 | Discharge: 2019-12-19 | Disposition: A | Discharge status: Home / Self Care | Payer: Medicaid Other | Attending: Emergency Medicine | Admitting: Emergency Medicine

## 2019-12-19 DIAGNOSIS — T783XXA Angioneurotic edema, initial encounter: Secondary | ICD-10-CM | POA: Diagnosis not present

## 2019-12-19 DIAGNOSIS — F17210 Nicotine dependence, cigarettes, uncomplicated: Secondary | ICD-10-CM | POA: Diagnosis not present

## 2019-12-19 DIAGNOSIS — Z79899 Other long term (current) drug therapy: Secondary | ICD-10-CM | POA: Diagnosis not present

## 2019-12-19 DIAGNOSIS — I10 Essential (primary) hypertension: Secondary | ICD-10-CM | POA: Insufficient documentation

## 2019-12-19 DIAGNOSIS — R600 Localized edema: Secondary | ICD-10-CM | POA: Diagnosis present

## 2019-12-19 LAB — I-STAT BETA HCG BLOOD, ED (MC, WL, AP ONLY): I-stat hCG, quantitative: <5 m[IU]/mL (ref <5)

## 2019-12-19 MED ORDER — DEXAMETHASONE SODIUM PHOSPHATE 10 MG/ML IJ SOLN
10.0000 mg | Freq: Once | INTRAMUSCULAR | Status: AC
  Administered 2019-12-19: 10 mg via INTRAVENOUS
  Filled 2019-12-19: qty 1

## 2019-12-19 MED ORDER — FAMOTIDINE IN NACL 20-0.9 MG/50ML-% IV SOLN
20.0000 mg | Freq: Once | INTRAVENOUS | Status: AC
  Administered 2019-12-19: 20 mg via INTRAVENOUS
  Filled 2019-12-19: qty 50

## 2019-12-19 MED ORDER — PREDNISONE 20 MG PO TABS: 60.0000 mg | ORAL_TABLET | Freq: Every day | ORAL | 0 refills | Status: AC

## 2019-12-19 MED ORDER — DIPHENHYDRAMINE HCL 50 MG/ML IJ SOLN
25.0000 mg | Freq: Once | INTRAMUSCULAR | Status: AC
  Administered 2019-12-19: 09:00:00 25 mg via INTRAVENOUS
  Filled 2019-12-19: qty 1

## 2019-12-19 MED ORDER — FAMOTIDINE 20 MG PO TABS: 20.0000 mg | ORAL_TABLET | Freq: Two times a day (BID) | ORAL | 0 refills | Status: AC

## 2019-12-19 NOTE — ED Notes (Signed)
Patient Alert and oriented to baseline. Stable and ambulatory to baseline. Patient verbalized understanding of the discharge instructions.  Patient belongings were taken by the patient.   

## 2019-12-19 NOTE — ED Provider Notes (Signed)
[facility] EMERGENCY DEPARTMENT Provider Note   CSN: [id number] Arrival date & time: 12/19/19  2536     History Chief Complaint  Patient presents with  . Oral Swelling    Veronica King is a 44 y.o. female.  She has a history of hypertension and is on lisinopril.  She is doing a fast where she is drinking just water for the last few days and had a peach yogurt last night.  She started noticing last night that her lower lip felt tight.  She woke up this morning and saw that it was swollen.  No tongue swelling no difficulty swallowing or speaking.  No prior history of allergic reactions.  The history is provided by the patient.  Allergic Reaction Presenting symptoms: swelling   Presenting symptoms: no difficulty breathing, no difficulty swallowing, no itching, no rash and no wheezing   Severity:  Moderate Duration:  12 hours Prior allergic episodes:  No prior episodes Context: dairy/milk products and medications   Relieved by:  None tried Worsened by:  Nothing Ineffective treatments:  None tried      Past Medical History:  Diagnosis Date  . Hypertension     Patient Active Problem List   Diagnosis Date Noted  . Essential hypertension, benign 10/17/2013  . 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 ALLERGIC RHINITIS WITH CONJUNCTIVITIS 11/06/2010  . ELEVATED BLOOD PRESSURE WITHOUT DIAGNOSIS OF HYPERTENSION 11/06/2010  . EDEMA OF EYELID 08/18/2010  . BREAST MASS, RIGHT 08/18/2010    Past Surgical History:  Procedure Laterality Date  . CESAREAN SECTION       OB History   No obstetric history on file.     Family History  Problem Relation Age of Onset  . Cancer Sister   . Cancer Maternal Aunt   . Hypertension Maternal Grandmother   . Drug abuse Maternal Grandmother   . Hypertension Paternal Grandmother   . Drug abuse Paternal Grandmother     Social History   Tobacco Use  . Smoking status: Current Every Day Smoker    Packs/day: 0.30    Years:  1.00    Pack years: 0.30  . Smokeless tobacco: Never Used  Substance Use Topics  . Alcohol use: Yes  . Drug use: No    Home Medications Prior to Admission medications   Medication Sig Start Date End Date Taking? Authorizing Provider  cyclobenzaprine (FLEXERIL) 10 MG tablet Take 1 tablet (10 mg total) by mouth 3 (three) times daily as needed for muscle spasms. 09/06/19   Lucrezia Starch, MD  diazepam (VALIUM) 5 MG tablet Take 1 tablet (5 mg total) by mouth 2 (two) times daily. 10/01/14   Waynetta Pean, PA-C  methocarbamol (ROBAXIN) 500 MG tablet Take 1 tablet (500 mg total) by mouth 2 (two) times daily. 05/07/17   Volanda Napoleon, PA-C  naproxen (NAPROSYN) 500 MG tablet Take 1 tablet (500 mg total) by mouth 2 (two) times daily as needed. 09/06/19   Lucrezia Starch, MD  predniSONE (DELTASONE) 20 MG tablet 3 tabs po day one, then 2 tabs daily x 4 days 05/27/15   Comer Locket, PA-C    Allergies    Patient has no known allergies.  Review of Systems   Review of Systems  Constitutional: Negative for fever.  HENT: Positive for facial swelling. Negative for sore throat and trouble swallowing.   Eyes: Negative for visual disturbance.  Respiratory: Negative for shortness of breath and wheezing.   Cardiovascular: Negative for chest pain.  Gastrointestinal: Negative for abdominal pain.  Genitourinary: Negative for dysuria.  Musculoskeletal: Negative for neck pain.  Skin: Negative for itching and rash.  Neurological: Negative for headaches.    Physical Exam Updated Vital Signs BP (!) 146/121 (BP Location: Left Arm)   Pulse (!) 131   Temp 98.4 F (36.9 C) (Oral)   Resp 18   SpO2 100%   Physical Exam Vitals and nursing note reviewed.  Constitutional:      General: She is not in acute distress.    Appearance: She is well-developed.  HENT:     Head: Normocephalic and atraumatic.     Mouth/Throat:     Mouth: Mucous membranes are moist.     Pharynx: Oropharynx is clear.       Comments: She has moderate swelling of her lower lip.  There is no tongue swelling and no trismus.  No other facial swelling appreciated. Eyes:     Conjunctiva/sclera: Conjunctivae normal.  Cardiovascular:     Rate and Rhythm: Normal rate and regular rhythm.     Heart sounds: No murmur.  Pulmonary:     Effort: Pulmonary effort is normal. No respiratory distress.     Breath sounds: Normal breath sounds.  Abdominal:     Palpations: Abdomen is soft.     Tenderness: There is no abdominal tenderness.  Musculoskeletal:        General: No deformity or signs of injury. Normal range of motion.     Cervical back: Neck supple.  Skin:    General: Skin is warm and dry.     Capillary Refill: Capillary refill takes less than 2 seconds.  Neurological:     General: No focal deficit present.     Mental Status: She is alert.     ED Results / Procedures / Treatments   Labs (all labs ordered are listed, but only abnormal results are displayed) Labs Reviewed  I-STAT BETA HCG BLOOD, ED ([facility], [facility], [facility] ONLY)    EKG None  Radiology No results found.  Procedures Procedures (including critical care time)  Medications Ordered in ED Medications  diphenhydrAMINE (BENADRYL) injection 25 mg (25 mg Intravenous Given 12/19/19 0856)  dexamethasone (DECADRON) injection 10 mg (10 mg Intravenous Given 12/19/19 0856)  famotidine (PEPCID) IVPB 20 mg premix (0 mg Intravenous Stopped 12/19/19 1127)    ED Course  I have reviewed the triage vital signs and the nursing notes.  Pertinent labs & imaging results that were available during my care of the patient were reviewed by me and considered in my medical decision making (see chart for details).  Clinical Course as of Dec 19 1639  Wed Dec 19, 2019  2703 Differential includes allergic reaction and angioedema.  Patient is on an ACE inhibitor.  No new meds.  Only new food was a peach yogurt.  No trauma no fever.  Will give standard allergic reaction medications  and observe.   [MB]  [phone] Do not feel she needs epinephrine right now.   [MB]  1025 Reevaluated, patient states she does not feel any progression and may be slight improvement in her lip tightness and swelling.   [MB]  1119 Patient has been observed for approximately 3 hours with some improvement in her symptoms.  She is comfortable going home and she understands she needs to stop her lisinopril and contact her primary care doctor.  We will cover her with some allergy medication for few days.   [MB]    Clinical Course User Index [MB]  Terrilee Files, MD   MDM Rules/Calculators/A&P                       Final Clinical Impression(s) / ED Diagnoses Final diagnoses:  Angioedema, initial encounter    Rx / DC Orders ED Discharge Orders         Ordered    predniSONE (DELTASONE) 20 MG tablet  Daily     12/19/19 1120    famotidine (PEPCID) 20 MG tablet  2 times daily     12/19/19 1120           Terrilee Files, MD 12/19/19 1642

## 2019-12-19 NOTE — Discharge Instructions (Signed)
You were seen in the emergency department for swelling to your lower lip.  You received some medications you were observed for period of time with some improvement in your symptoms.  This may be a condition called angioedema and possibly caused by your lisinopril.  Please stop taking that medicine and contact your primary care doctor to discuss a new medication.  Return to the emergency department if any worsening swelling.

## 2019-12-19 NOTE — ED Triage Notes (Signed)
Pt arrives to ED with lower lip swelling. Pt is on a spiritual fast and has only had peach yogurt last night. Pt also on lisinopril.

## 2020-06-27 ENCOUNTER — Other Ambulatory Visit: Payer: Self-pay | Admitting: Sleep Medicine

## 2020-06-27 ENCOUNTER — Other Ambulatory Visit: Payer: Medicaid Other

## 2020-06-27 DIAGNOSIS — Z20822 Contact with and (suspected) exposure to covid-19: Secondary | ICD-10-CM

## 2020-06-28 LAB — NOVEL CORONAVIRUS, NAA: SARS-CoV-2, NAA: NOT DETECTED

## 2020-06-28 LAB — SARS-COV-2, NAA 2 DAY TAT

## 2020-06-28 LAB — SPECIMEN STATUS REPORT
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
